# Patient Record
Sex: Female | Born: 1955 | Race: White | Hispanic: No | Marital: Married | Smoking: Never smoker
Health system: Southern US, Community
[De-identification: ages and names within clinical notes are randomized; demographics above are authoritative.]

## PROBLEM LIST (undated history)

## (undated) DIAGNOSIS — D259 Leiomyoma of uterus, unspecified: Secondary | ICD-10-CM

## (undated) HISTORY — PX: CHOLECYSTECTOMY: SHX55

## (undated) HISTORY — DX: Leiomyoma of uterus, unspecified: D25.9

---

## 2001-04-25 ENCOUNTER — Ambulatory Visit (HOSPITAL_COMMUNITY): Admission: RE | Admit: 2001-04-25 | Discharge: 2001-04-25 | Payer: Self-pay | Admitting: Specialist

## 2001-04-25 ENCOUNTER — Encounter: Payer: Self-pay | Admitting: Specialist

## 2002-01-13 ENCOUNTER — Ambulatory Visit (HOSPITAL_COMMUNITY): Admission: RE | Admit: 2002-01-13 | Discharge: 2002-01-13 | Payer: Self-pay | Admitting: Family Medicine

## 2002-01-13 ENCOUNTER — Encounter: Payer: Self-pay | Admitting: Family Medicine

## 2003-05-04 ENCOUNTER — Encounter: Payer: Self-pay | Admitting: Obstetrics & Gynecology

## 2003-05-04 ENCOUNTER — Ambulatory Visit (HOSPITAL_COMMUNITY): Admission: RE | Admit: 2003-05-04 | Discharge: 2003-05-04 | Payer: Self-pay | Admitting: Obstetrics & Gynecology

## 2006-03-08 ENCOUNTER — Ambulatory Visit (HOSPITAL_COMMUNITY): Admission: RE | Admit: 2006-03-08 | Discharge: 2006-03-08 | Payer: Self-pay | Admitting: Obstetrics & Gynecology

## 2007-03-19 ENCOUNTER — Ambulatory Visit (HOSPITAL_COMMUNITY): Admission: RE | Admit: 2007-03-19 | Discharge: 2007-03-19 | Payer: Self-pay | Admitting: Specialist

## 2007-03-21 ENCOUNTER — Ambulatory Visit (HOSPITAL_COMMUNITY): Admission: RE | Admit: 2007-03-21 | Discharge: 2007-03-21 | Payer: Self-pay | Admitting: Obstetrics and Gynecology

## 2010-05-17 LAB — BASIC METABOLIC PANEL
BUN: 15 mg/dL (ref 4–21)
Creatinine: 0.7 mg/dL (ref 0.5–1.1)
Glucose: 85 mg/dL

## 2010-05-17 LAB — CBC AND DIFFERENTIAL
HCT: 39 % (ref 36–46)
Hemoglobin: 13 g/dL (ref 12.0–16.0)

## 2010-05-24 ENCOUNTER — Ambulatory Visit (HOSPITAL_COMMUNITY): Admission: RE | Admit: 2010-05-24 | Discharge: 2010-05-24 | Payer: Self-pay | Admitting: Family Medicine

## 2010-08-21 ENCOUNTER — Encounter: Payer: Self-pay | Admitting: Internal Medicine

## 2011-08-04 ENCOUNTER — Other Ambulatory Visit (HOSPITAL_COMMUNITY): Payer: Self-pay | Admitting: Family Medicine

## 2011-08-04 DIAGNOSIS — Z139 Encounter for screening, unspecified: Secondary | ICD-10-CM

## 2011-08-17 ENCOUNTER — Ambulatory Visit (HOSPITAL_COMMUNITY)
Admission: RE | Admit: 2011-08-17 | Discharge: 2011-08-17 | Disposition: A | Discharge status: Home / Self Care | Payer: 59 | Source: Ambulatory Visit | Attending: Family Medicine | Admitting: Family Medicine

## 2011-08-17 DIAGNOSIS — Z1231 Encounter for screening mammogram for malignant neoplasm of breast: Secondary | ICD-10-CM | POA: Insufficient documentation

## 2011-08-17 DIAGNOSIS — Z139 Encounter for screening, unspecified: Secondary | ICD-10-CM

## 2011-12-29 ENCOUNTER — Ambulatory Visit (INDEPENDENT_AMBULATORY_CARE_PROVIDER_SITE_OTHER): Payer: 59 | Admitting: Family Medicine

## 2011-12-29 ENCOUNTER — Encounter: Payer: Self-pay | Admitting: Family Medicine

## 2011-12-29 VITALS — BP 122/84 | HR 78 | Resp 16 | Ht 65.0 in | Wt 229.1 lb

## 2011-12-29 DIAGNOSIS — L259 Unspecified contact dermatitis, unspecified cause: Secondary | ICD-10-CM

## 2011-12-29 DIAGNOSIS — E669 Obesity, unspecified: Secondary | ICD-10-CM

## 2011-12-29 DIAGNOSIS — L309 Dermatitis, unspecified: Secondary | ICD-10-CM

## 2011-12-29 NOTE — Assessment & Plan Note (Signed)
Encouraged hydration of skin and use a when necessary hydrocortisone

## 2011-12-29 NOTE — Patient Instructions (Signed)
Fax me the results of your previous cholesterol and labs from  F/U with OB/GYn for your exam  I recommend Calcium (1200mg )  and Vit D ( 800IU) Work on your exercise and healthy eating! Use plain soap with eczema rash is out Hydrocortisone is okay to use  Thick white lotion is best for itching  F/U 6 months

## 2011-12-29 NOTE — Progress Notes (Signed)
  Subjective:    Patient ID: Penny Mcguire, female    DOB: 01/16/1956, 56 y.o.   MRN: [id number]  HPI  Patient here to establish care. Previous primary care provider 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[employer] Medications none history reviewed Her only concern is that she gets an itchy rash on the inner part of her elbows and behind her knees and occasionally in the creases of her groin when she gets really hot she uses lotion and cortisone and this clears up quickly  Review of Systems GEN- denies fatigue, fever, weight loss,weakness, recent illness HEENT- denies eye drainage, change in vision, nasal discharge, CVS- denies chest pain, palpitations RESP- denies SOB, cough, wheeze ABD- denies N/V, change in stools, abd pain GU- denies dysuria, hematuria, dribbling, incontinence MSK- denies joint pain, muscle aches, injury Neuro- denies headache, dizziness, syncope, seizure activity      Objective:   Physical Exam GEN- NAD, alert and oriented x3, obese HEENT- PERRL, EOMI, non injected sclera, pink conjunctiva, MMM, oropharynx clear Neck- Supple, no thryomegaly CVS- RRR, no murmur RESP-CTAB ABD-NABS,soft, NT,ND Skin- mild eczematous rash in flexural areas of elbows, no other rash seen  EXT- No edema Pulses- Radial, DP- 2+        Assessment & Plan:

## 2011-12-29 NOTE — Assessment & Plan Note (Signed)
Discussed exercise with patient. She's had labs done at work approximately 2 years ago where her lipid panel and metabolic panel were normal. Typically these are needed in October and her workup we begin wait until then as her previous were normal. They do not repeat the series will be obtained at the next visit. I also discussed colonoscopy with patient and she is reluctant at this time. She has a GYN exam scheduled for next week for Pap smear

## 2012-01-01 ENCOUNTER — Encounter: Payer: Self-pay | Admitting: Family Medicine

## 2012-01-03 ENCOUNTER — Other Ambulatory Visit: Payer: Self-pay | Admitting: Adult Health

## 2012-01-03 ENCOUNTER — Other Ambulatory Visit (HOSPITAL_COMMUNITY)
Admission: RE | Admit: 2012-01-03 | Discharge: 2012-01-03 | Disposition: A | Discharge status: Home / Self Care | Payer: 59 | Source: Ambulatory Visit | Attending: Obstetrics and Gynecology | Admitting: Obstetrics and Gynecology

## 2012-01-03 DIAGNOSIS — Z1159 Encounter for screening for other viral diseases: Secondary | ICD-10-CM | POA: Insufficient documentation

## 2012-01-03 DIAGNOSIS — Z01419 Encounter for gynecological examination (general) (routine) without abnormal findings: Secondary | ICD-10-CM | POA: Insufficient documentation

## 2012-08-29 ENCOUNTER — Encounter: Payer: Self-pay | Admitting: Family Medicine

## 2012-08-29 ENCOUNTER — Ambulatory Visit (INDEPENDENT_AMBULATORY_CARE_PROVIDER_SITE_OTHER): Payer: 59 | Admitting: Family Medicine

## 2012-08-29 VITALS — BP 120/72 | HR 100 | Resp 18 | Ht 65.0 in | Wt 226.0 lb

## 2012-08-29 DIAGNOSIS — L309 Dermatitis, unspecified: Secondary | ICD-10-CM

## 2012-08-29 DIAGNOSIS — E669 Obesity, unspecified: Secondary | ICD-10-CM

## 2012-08-29 DIAGNOSIS — R21 Rash and other nonspecific skin eruption: Secondary | ICD-10-CM

## 2012-08-29 DIAGNOSIS — L259 Unspecified contact dermatitis, unspecified cause: Secondary | ICD-10-CM

## 2012-08-29 MED ORDER — METHYLPREDNISOLONE ACETATE 40 MG/ML IJ SUSP
40.0000 mg | Freq: Once | INTRAMUSCULAR | Status: AC
  Administered 2012-08-29: 40 mg via INTRAMUSCULAR

## 2012-08-29 MED ORDER — METHYLPREDNISOLONE 4 MG PO KIT: PACK | ORAL | Status: DC

## 2012-08-29 NOTE — Assessment & Plan Note (Signed)
Very pruritic in nature, appears to be a dermatitis of some sort. Given depo medrol, medrol dose pak start tomorrow Benadryl prn Oatmeal bath

## 2012-08-29 NOTE — Addendum Note (Signed)
Addended by: Kandis Fantasia B on: 08/29/2012 04:48 PM   Modules accepted: Orders

## 2012-08-29 NOTE — Progress Notes (Signed)
  Subjective:    Patient ID: Penny Mcguire, female    DOB: April 25, 1956, 57 y.o.   MRN: [id number]  HPI  Patient here with worsening eczema   The past 3 weeks she noticed that her skin was getting very dry then she began to break out in a red rash which is around her chest back abdomen and legs. Is very itchy in nature she's only use lotions and no other topical medications. She denies any fever, URI symptoms.  For work to not have to wellness screening this year therefore she does not have fasting labs done She was exercising with a partner at work around lunchtime however has slacked off of this  Review of Systems - per above  GEN- denies fatigue, fever, weight loss,weakness, recent illness HEENT- denies eye drainage, change in vision, nasal discharge, CVS- denies chest pain, palpitations RESP- denies SOB, cough, wheeze ABD- denies N/V, change in stools, abd pain GU- denies dysuria, hematuria, dribbling, incontinence MSK- denies joint pain, muscle aches, injury Neuro- denies headache, dizziness, syncope, seizure activity      Objective:   Physical Exam  GEN- NAD, alert and oriented x3, obese HEENT- PERRL, EOMI, non injected sclera, pink conjunctiva, MMM, oropharynx clear Neck- Supple, no LAD CVS- RRR, no murmur RESP-CTAB EXT- No edema Pulses- Radial 2+ Skin- erythematous fine maculopapular rash on chest, back, upper arms and thighs, +excoriations, no warmth , no pustules, no vesicles, face spared, palms spared      Assessment & Plan:

## 2012-08-29 NOTE — Assessment & Plan Note (Signed)
She was on the right track, given encouragement to restart her walking program, fasting labs to be done

## 2012-08-29 NOTE — Patient Instructions (Signed)
Get labs done fasting  Start the prednisone pak tomorrow Depo medrol shot given today  Use benadryl as needed Get back to your exercise routine Schedule your mammogram  F/U as needed or in 1 year

## 2012-08-29 NOTE — Assessment & Plan Note (Signed)
She does have outbreak in her typical areas, but the extensive rash is not eczema

## 2012-08-30 LAB — CBC
HCT: 41.4 % (ref 36.0–46.0)
MCH: 28.9 pg (ref 26.0–34.0)
MCV: 87.9 fL (ref 78.0–100.0)
Platelets: 283 10*3/uL (ref 150–400)
RBC: 4.71 MIL/uL (ref 3.87–5.11)
RDW: 13.9 % (ref 11.5–15.5)
WBC: 6.5 10*3/uL (ref 4.0–10.5)

## 2012-08-30 LAB — LIPID PANEL
HDL: 58 mg/dL (ref >39)
LDL Cholesterol: 136 mg/dL — ABNORMAL HIGH (ref 0–99)
Total CHOL/HDL Ratio: 3.7 Ratio
Triglycerides: 101 mg/dL (ref <150)
VLDL: 20 mg/dL (ref 0–40)

## 2012-08-30 LAB — COMPREHENSIVE METABOLIC PANEL
ALT: 25 U/L (ref 0–35)
Alkaline Phosphatase: 92 U/L (ref 39–117)
CO2: 24 mEq/L (ref 19–32)
Creat: 0.77 mg/dL (ref 0.50–1.10)
Sodium: 141 mEq/L (ref 135–145)
Total Bilirubin: 0.4 mg/dL (ref 0.3–1.2)
Total Protein: 7.1 g/dL (ref 6.0–8.3)

## 2012-09-09 ENCOUNTER — Telehealth: Payer: Self-pay | Admitting: Family Medicine

## 2012-09-10 NOTE — Telephone Encounter (Signed)
Called and left message on home answering machine as I did not want to bother her at work.  Will await call back.

## 2013-03-05 ENCOUNTER — Other Ambulatory Visit: Payer: Self-pay | Admitting: Family Medicine

## 2013-03-05 DIAGNOSIS — Z139 Encounter for screening, unspecified: Secondary | ICD-10-CM

## 2013-03-07 ENCOUNTER — Ambulatory Visit (HOSPITAL_COMMUNITY)
Admission: RE | Admit: 2013-03-07 | Discharge: 2013-03-07 | Disposition: A | Discharge status: Home / Self Care | Payer: 59 | Source: Ambulatory Visit | Attending: Family Medicine | Admitting: Family Medicine

## 2013-03-07 DIAGNOSIS — Z139 Encounter for screening, unspecified: Secondary | ICD-10-CM

## 2013-03-07 DIAGNOSIS — Z1231 Encounter for screening mammogram for malignant neoplasm of breast: Secondary | ICD-10-CM | POA: Insufficient documentation

## 2013-03-10 ENCOUNTER — Encounter: Payer: Self-pay | Admitting: Family Medicine

## 2014-01-16 ENCOUNTER — Ambulatory Visit (INDEPENDENT_AMBULATORY_CARE_PROVIDER_SITE_OTHER): Payer: 59 | Admitting: Family Medicine

## 2014-01-16 ENCOUNTER — Encounter: Payer: Self-pay | Admitting: Family Medicine

## 2014-01-16 VITALS — BP 136/74 | HR 64 | Temp 97.8°F | Resp 14 | Ht 64.0 in | Wt 221.0 lb

## 2014-01-16 DIAGNOSIS — M545 Low back pain, unspecified: Secondary | ICD-10-CM

## 2014-01-16 DIAGNOSIS — M25561 Pain in right knee: Secondary | ICD-10-CM

## 2014-01-16 DIAGNOSIS — E669 Obesity, unspecified: Secondary | ICD-10-CM

## 2014-01-16 DIAGNOSIS — M25569 Pain in unspecified knee: Secondary | ICD-10-CM

## 2014-01-16 MED ORDER — METHYLPREDNISOLONE (PAK) 4 MG PO TABS: ORAL_TABLET | ORAL | Status: DC

## 2014-01-16 MED ORDER — TRAMADOL HCL 50 MG PO TABS: 50.0000 mg | ORAL_TABLET | Freq: Three times a day (TID) | ORAL | Status: DC | PRN

## 2014-01-16 NOTE — Patient Instructions (Signed)
Start steroid dose pak Take Ultram as needed for pain Get the xrays done F/U as needed

## 2014-01-17 NOTE — Progress Notes (Signed)
Patient ID: Penny Mcguire, female   DOB: 1956/06/29, 58 y.o.   MRN: [id number]   Subjective:    Patient ID: Penny Mcguire, female    DOB: 1956/04/10, 58 y.o.   MRN: [id number]  Patient presents for R knee and hip pain  patient here with right knee pain and low back pain for the past couple months. No specific injury. 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 She states she had swelling in the knee and felt like he was clinically her. She did take some ibuprofen but this was not helping as much. Her hip pain is mostly in the back for lichen her buttocks. She denies any radiating pain to her legs denies any tingling or numbness in the lower extremities. She's not had any falls. TCC pain in her knee and back is worsened she lays on that side and also she's been sitting for prolonged period of time she is very stiff afterwards   Review Of Systems:  GEN- denies fatigue, fever, weight loss,weakness, recent illness HEENT- denies eye drainage, change in vision, nasal discharge, CVS- denies chest pain, palpitations RESP- denies SOB, cough, wheeze ABD- denies N/V, change in stools, abd pain GU- denies dysuria, hematuria, dribbling, incontinence MSK- + joint pain, muscle aches, injury Neuro- denies headache, dizziness, syncope, seizure activity       Objective:    BP 136/74  Pulse 64  Temp(Src) 97.8 F (36.6 C) (Oral)  Resp 14  Ht 5\' 4"  (1.626 m)  Wt 221 lb (100.245 kg)  BMI 37.92 kg/m2 GEN- NAD, alert and oriented x3 HEENT- PERRL, EOMI, non injected sclera, pink conjunctiva, MMM, oropharynx clear Neck- Supple, no thyromegaly CVS- RRR, no murmur RESP-CTAB MSK- Spine, TTP lumbar spine and towards right paraspinals, neg SLR, fair ROM, Bilat knees normal inspection, no effusion, mild crepitus right knee, fair ROM, HIp- good ROM Pulses- Radial, DP- 2+        Assessment & Plan:      Problem List Items Addressed This Visit   None    Visit Diagnoses   Right knee pain    -  Primary    Will  place of medrol dosepak because of concurrent back pain, Ultram also given, obtain image, refer to ortho if needed    Relevant Orders       DG Knee 4 Views W/Patella Right    Right-sided low back pain without sciatica        Medrol dosepak, L spine, likley DDD    Relevant Medications       traMADol (ULTRAM) tablet 50 mg       Methylprednisolone (MEDROL PAK) 4 mg po tab    Other Relevant Orders       DG Lumbar Spine Complete       Note: This dictation was prepared with Dragon dictation along with smaller phrase technology. Any transcriptional errors that result from this process are unintentional.

## 2014-01-20 ENCOUNTER — Ambulatory Visit (HOSPITAL_COMMUNITY)
Admission: RE | Admit: 2014-01-20 | Discharge: 2014-01-20 | Disposition: A | Discharge status: Home / Self Care | Payer: 59 | Source: Ambulatory Visit | Attending: Family Medicine | Admitting: Family Medicine

## 2014-01-20 DIAGNOSIS — M25561 Pain in right knee: Secondary | ICD-10-CM

## 2014-01-20 DIAGNOSIS — M545 Low back pain, unspecified: Secondary | ICD-10-CM

## 2014-01-20 DIAGNOSIS — M25569 Pain in unspecified knee: Secondary | ICD-10-CM | POA: Insufficient documentation

## 2014-01-20 DIAGNOSIS — M47817 Spondylosis without myelopathy or radiculopathy, lumbosacral region: Secondary | ICD-10-CM | POA: Insufficient documentation

## 2014-09-08 ENCOUNTER — Other Ambulatory Visit: Payer: Self-pay | Admitting: Family Medicine

## 2014-09-08 DIAGNOSIS — Z1231 Encounter for screening mammogram for malignant neoplasm of breast: Secondary | ICD-10-CM

## 2014-09-11 ENCOUNTER — Ambulatory Visit (HOSPITAL_COMMUNITY)
Admission: RE | Admit: 2014-09-11 | Discharge: 2014-09-11 | Disposition: A | Discharge status: Home / Self Care | Payer: 59 | Source: Ambulatory Visit | Attending: Family Medicine | Admitting: Family Medicine

## 2014-09-11 DIAGNOSIS — Z1231 Encounter for screening mammogram for malignant neoplasm of breast: Secondary | ICD-10-CM | POA: Insufficient documentation

## 2015-09-10 ENCOUNTER — Ambulatory Visit (INDEPENDENT_AMBULATORY_CARE_PROVIDER_SITE_OTHER): Payer: 59 | Admitting: Family Medicine

## 2015-09-10 ENCOUNTER — Encounter: Payer: Self-pay | Admitting: Family Medicine

## 2015-09-10 VITALS — BP 124/78 | HR 80 | Temp 97.8°F | Resp 14 | Ht 65.0 in | Wt 214.0 lb

## 2015-09-10 DIAGNOSIS — J019 Acute sinusitis, unspecified: Secondary | ICD-10-CM

## 2015-09-10 MED ORDER — FLUCONAZOLE 150 MG PO TABS: 150.0000 mg | ORAL_TABLET | Freq: Once | ORAL | Status: DC

## 2015-09-10 MED ORDER — AMOXICILLIN-POT CLAVULANATE 875-125 MG PO TABS: 1.0000 | ORAL_TABLET | Freq: Two times a day (BID) | ORAL | Status: DC

## 2015-09-10 NOTE — Progress Notes (Signed)
   Subjective:    Patient ID: Penny Mcguire, female    DOB: 05/19/1956, 60 y.o.   MRN: [id number]  HPIis here today complaining of 3 weeks of sinus pressure in her frontal and maxillary sinuses bilaterally. She reports subjective fevers, headache, and nonstop rhinorrhea. She has sinus tenderness to palpation in the right maxillary and right frontal sinus. She denies any cough or chest congestion or sore throat Past Medical History  Diagnosis Date  . Fibroid uterus   . Gestational diabetes    No current outpatient prescriptions on file prior to visit.   No current facility-administered medications on file prior to visit.   No Known Allergies Social History   Social History  . Marital Status: Married    Spouse Name: N/A  . Number of Children: N/A  . Years of Education: N/A   Occupational History  . Not on file.   Social History Main Topics  . Smoking status: Never Smoker   . Smokeless tobacco: Never Used  . Alcohol Use: No  . Drug Use: No  . Sexual Activity: Yes   Other Topics Concern  . Not on file   Social History Narrative      Review of Systems  All other systems reviewed and are negative.      Objective:   Physical Exam  Constitutional: She appears well-developed and well-nourished.  HENT:  Right Ear: Tympanic membrane, external ear and ear canal normal.  Left Ear: Tympanic membrane, external ear and ear canal normal.  Nose: Mucosal edema and rhinorrhea present. Right sinus exhibits maxillary sinus tenderness and frontal sinus tenderness.  Mouth/Throat: Oropharynx is clear and moist. No oropharyngeal exudate.  Neck: Neck supple.  Cardiovascular: Normal rate, regular rhythm and normal heart sounds.   Pulmonary/Chest: Effort normal and breath sounds normal.  Lymphadenopathy:    She has no cervical adenopathy.  Vitals reviewed.         Assessment & Plan:  Acute rhinosinusitis - Plan: amoxicillin-clavulanate (AUGMENTIN) 875-125 MG tablet  Patient  has a sinus infection. Begin Augmentin 875 mg one by mouth twice a day for 10 days. I did give her a prescription for Diflucan in case she develops a yeast infection.

## 2015-11-11 ENCOUNTER — Telehealth: Payer: Self-pay | Admitting: Family Medicine

## 2015-11-15 ENCOUNTER — Ambulatory Visit (INDEPENDENT_AMBULATORY_CARE_PROVIDER_SITE_OTHER): Payer: 59 | Admitting: Family Medicine

## 2015-11-15 ENCOUNTER — Encounter: Payer: Self-pay | Admitting: Family Medicine

## 2015-11-15 VITALS — BP 128/72 | HR 66 | Temp 97.9°F | Resp 14 | Ht 65.0 in | Wt 212.0 lb

## 2015-11-15 DIAGNOSIS — J0100 Acute maxillary sinusitis, unspecified: Secondary | ICD-10-CM | POA: Diagnosis not present

## 2015-11-15 MED ORDER — FLUTICASONE PROPIONATE 50 MCG/ACT NA SUSP: 2.0000 | Freq: Every day | NASAL | Status: DC

## 2015-11-15 MED ORDER — AMOXICILLIN-POT CLAVULANATE 875-125 MG PO TABS: 1.0000 | ORAL_TABLET | Freq: Two times a day (BID) | ORAL | Status: DC

## 2015-11-15 MED ORDER — CETIRIZINE HCL 10 MG PO TABS: 10.0000 mg | ORAL_TABLET | Freq: Every day | ORAL | Status: DC

## 2015-11-15 MED ORDER — FLUCONAZOLE 150 MG PO TABS: 150.0000 mg | ORAL_TABLET | Freq: Every day | ORAL | Status: DC

## 2015-11-15 NOTE — Progress Notes (Signed)
Patient ID: Penny Mcguire, female   DOB: 1956-06-08, 60 y.o.   MRN: [id number]    Subjective:    Patient ID: Penny Mcguire, female    DOB: 1956-04-28, 60 y.o.   MRN: [id number]  Patient presents for Illness Patient here with recurrent sinusitis symptoms. For the past week or more she has had nasal congestion drainage postnasal drip causing a scratchy throat is also had dry cough. She initially had some low-grade fever but has no results. She has been using Robitussin and NyQuil. She also noticed a hoarse voice. She is a nonsmoker.    Review Of Systems:  GEN- denies fatigue, fever, weight loss,weakness, recent illness HEENT- denies eye drainage, change in vision,+ nasal discharge, CVS- denies chest pain, palpitations RESP- denies SOB, +cough, wheeze ABD- denies N/V, change in stools, abd pain Neuro- denies headache, dizziness, syncope, seizure activity       Objective:    BP 128/72 mmHg  Pulse 66  Temp(Src) 97.9 F (36.6 C) (Oral)  Resp 14  Ht 5\' 5"  (1.651 m)  Wt 212 lb (96.163 kg)  BMI 35.28 kg/m2  SpO2 97% GEN- NAD, alert and oriented x3 HEENT- PERRL, EOMI, non injected sclera, pink conjunctiva, MMM, oropharynx mild injection, TM clear bilat no effusion,  + maxillary sinus tenderness, inflammed turbinates,  Nasal drainage  Neck- Supple, no LAD CVS- RRR, no murmur RESP-CTAB Pulses- Radial 2+         Assessment & Plan:      Problem List Items Addressed This Visit    None    Visit Diagnoses    Acute maxillary sinusitis, recurrence not specified    -  Primary    Treated in Feb for sinusitis, did completely resolve, I think there is an allergy component as well., Start zyrtec, flonase, given augmentin, Diflucan due to yeast infections, after antibiotics Pt overdue for CPE - to schedule    Relevant Medications    amoxicillin-clavulanate (AUGMENTIN) 875-125 MG tablet    cetirizine (ZYRTEC) 10 MG tablet    fluticasone (FLONASE) 50 MCG/ACT nasal spray    fluconazole (DIFLUCAN) 150 MG tablet       Note: This dictation was prepared with Dragon dictation along with smaller phrase technology. Any transcriptional errors that result from this process are unintentional.

## 2015-11-15 NOTE — Patient Instructions (Signed)
Take antibiotics Use nasal spray and allergy pill to prevent recurrence Schedule a physical - Come fasting

## 2016-03-30 ENCOUNTER — Other Ambulatory Visit: Payer: Self-pay | Admitting: Obstetrics & Gynecology

## 2016-03-30 DIAGNOSIS — Z1231 Encounter for screening mammogram for malignant neoplasm of breast: Secondary | ICD-10-CM

## 2016-07-25 ENCOUNTER — Other Ambulatory Visit: Payer: Self-pay | Admitting: Adult Health

## 2016-07-25 DIAGNOSIS — Z1231 Encounter for screening mammogram for malignant neoplasm of breast: Secondary | ICD-10-CM

## 2016-07-27 ENCOUNTER — Ambulatory Visit (HOSPITAL_COMMUNITY)
Admission: RE | Admit: 2016-07-27 | Discharge: 2016-07-27 | Disposition: A | Discharge status: Home / Self Care | Payer: 59 | Source: Ambulatory Visit | Attending: Adult Health | Admitting: Adult Health

## 2016-07-27 DIAGNOSIS — Z1231 Encounter for screening mammogram for malignant neoplasm of breast: Secondary | ICD-10-CM | POA: Diagnosis not present

## 2017-02-12 ENCOUNTER — Encounter (HOSPITAL_COMMUNITY): Payer: Self-pay

## 2017-02-12 ENCOUNTER — Emergency Department (HOSPITAL_COMMUNITY): Payer: PRIVATE HEALTH INSURANCE

## 2017-02-12 ENCOUNTER — Emergency Department (HOSPITAL_COMMUNITY)
Admission: EM | Admit: 2017-02-12 | Discharge: 2017-02-12 | Disposition: A | Discharge status: Home / Self Care | Payer: PRIVATE HEALTH INSURANCE | Attending: Emergency Medicine | Admitting: Emergency Medicine

## 2017-02-12 DIAGNOSIS — Y9229 Other specified public building as the place of occurrence of the external cause: Secondary | ICD-10-CM | POA: Diagnosis not present

## 2017-02-12 DIAGNOSIS — J3489 Other specified disorders of nose and nasal sinuses: Secondary | ICD-10-CM | POA: Diagnosis not present

## 2017-02-12 DIAGNOSIS — W0110XA Fall on same level from slipping, tripping and stumbling with subsequent striking against unspecified object, initial encounter: Secondary | ICD-10-CM | POA: Insufficient documentation

## 2017-02-12 DIAGNOSIS — S022XXA Fracture of nasal bones, initial encounter for closed fracture: Secondary | ICD-10-CM | POA: Insufficient documentation

## 2017-02-12 DIAGNOSIS — Y998 Other external cause status: Secondary | ICD-10-CM | POA: Diagnosis not present

## 2017-02-12 DIAGNOSIS — R51 Headache: Secondary | ICD-10-CM | POA: Diagnosis present

## 2017-02-12 DIAGNOSIS — S022XXB Fracture of nasal bones, initial encounter for open fracture: Secondary | ICD-10-CM

## 2017-02-12 DIAGNOSIS — Y9389 Activity, other specified: Secondary | ICD-10-CM | POA: Insufficient documentation

## 2017-02-12 MED ORDER — AMOXICILLIN-POT CLAVULANATE 875-125 MG PO TABS: 1.0000 | ORAL_TABLET | Freq: Two times a day (BID) | ORAL | 0 refills | Status: DC

## 2017-02-12 MED ORDER — IBUPROFEN 800 MG PO TABS
800.0000 mg | ORAL_TABLET | Freq: Once | ORAL | Status: AC
  Administered 2017-02-12: 800 mg via ORAL
  Filled 2017-02-12: qty 1

## 2017-02-12 MED ORDER — IBUPROFEN 600 MG PO TABS: 600.0000 mg | ORAL_TABLET | Freq: Four times a day (QID) | ORAL | 0 refills | Status: DC | PRN

## 2017-02-12 NOTE — ED Triage Notes (Signed)
Patient reports of tripping and fell forwards and hit face on floor. Denies loc, neck or back pain. Has swelling noted above right eye brown as well as nose bleeding. Bleeding controlled at this time.

## 2017-02-12 NOTE — Discharge Instructions (Signed)
Avoid blowing your nose as discussed for the next few days.  Sleeping in a semi upright (propped on pillows) can help minimize swelling and bruising.  Call Dr. Benjamine Mola as discussed for further evaluation and reduction (straightening of your nasal bone).  He will want to do this once the swelling is improved.

## 2017-02-13 MED FILL — IBUPROFEN 600 MG TABLET: 600 | 8 days supply | Qty: 30 | Fill #0

## 2017-02-13 MED FILL — AMOX-CLAV 875-125 MG TABLET: 875-125 | 7 days supply | Qty: 14 | Fill #0

## 2017-02-14 NOTE — ED Provider Notes (Signed)
[facility] DEPT Provider Note   CSN: [id number] Arrival date & time: 02/12/17  1330     History   Chief Complaint Chief Complaint  Patient presents with  . Fall    HPI Penny Mcguire is a 61 y.o. female who works at [employer], tripped and fell in the cafeteria prior to arrival landing face forward, predominantly hitting her nose and her right forehead.  She initially had a significant nose bleed which has resolved after applying pressure.  She endorses persistent headache, denies loc, n/v, dizziness, confusion, neck pain. She denies other pain or injury and has taken ibuprofen prior to arrival with some improvement in headache.    The history is provided by the patient.    Past Medical History:  Diagnosis Date  . Fibroid uterus     Patient Active Problem List   Diagnosis Date Noted  . Eczema 12/29/2011  . Obesity, unspecified 12/29/2011    Past Surgical History:  Procedure Laterality Date  . CESAREAN SECTION     2  . CHOLECYSTECTOMY      OB History    No data available       Home Medications    Prior to Admission medications   Medication Sig Start Date End Date Taking? Authorizing Provider  amoxicillin-clavulanate (AUGMENTIN) 875-125 MG tablet Take 1 tablet by mouth every 12 (twelve) hours. 02/12/17   Evalee Jefferson, PA-C  cetirizine (ZYRTEC) 10 MG tablet Take 1 tablet (10 mg total) by mouth daily. 11/15/15   Alycia Rossetti, MD  fluconazole (DIFLUCAN) 150 MG tablet Take 1 tablet (150 mg total) by mouth daily. 11/15/15   [place], Modena Nunnery, MD  fluticasone [facility]) 50 MCG/ACT nasal spray Place 2 sprays into both nostrils daily. 11/15/15   , Modena Nunnery, MD  ibuprofen (ADVIL,MOTRIN) 600 MG tablet Take 1 tablet (600 mg total) by mouth every 6 (six) hours as needed. 02/12/17   Evalee Jefferson, PA-C    Family History Family History  Problem Relation Age of Onset  . Hyperlipidemia Mother   . Hypertension Mother   . Hyperlipidemia Sister   . Diabetes Brother      Social History Social History  Substance Use Topics  . Smoking status: Never Smoker  . Smokeless tobacco: Never Used  . Alcohol use No     Allergies   Patient has no known allergies.   Review of Systems Review of Systems  Constitutional: Negative for fever.  HENT: Positive for facial swelling and nosebleeds. Negative for congestion, ear discharge, rhinorrhea and sore throat.   Eyes: Negative.   Respiratory: Negative for chest tightness and shortness of breath.   Cardiovascular: Negative for chest pain.  Gastrointestinal: Negative for abdominal pain, nausea and vomiting.  Genitourinary: Negative.   Musculoskeletal: Negative for arthralgias, joint swelling and neck pain.  Skin: Negative.  Negative for rash and wound.  Neurological: Positive for headaches. Negative for dizziness, weakness, light-headedness and numbness.  Psychiatric/Behavioral: Negative.      Physical Exam Updated Vital Signs BP (!) 149/86 (BP Location: Right Arm)   Pulse 76   Temp 97.6 F (36.4 C) (Oral)   Resp 17   Ht 5\' 5"  (1.651 m)   Wt 102.1 kg (225 lb)   SpO2 97%   BMI 37.44 kg/m   Physical Exam  Constitutional: She appears well-developed and well-nourished.  HENT:  Head: Normocephalic.  Right Ear: Tympanic membrane normal. No hemotympanum.  Left Ear: Tympanic membrane normal. No hemotympanum.  Nose: Sinus tenderness and nasal deformity present.  No nose lacerations or nasal septal hematoma. No epistaxis.  No current epistaxis. Small hematoma right forehead.    Eyes: Conjunctivae are normal.  Neck: Normal range of motion. No spinous process tenderness and no muscular tenderness present. No neck rigidity. Normal range of motion present.  Cardiovascular: Normal rate, regular rhythm, normal heart sounds and intact distal pulses.   Pulmonary/Chest: Effort normal and breath sounds normal. She has no wheezes.  Abdominal: Soft. Bowel sounds are normal. There is no tenderness.  Musculoskeletal:  Normal range of motion.  Neurological: She is alert.  Skin: Skin is warm and dry.  Psychiatric: She has a normal mood and affect.  Nursing note and vitals reviewed.    ED Treatments / Results  Labs (all labs ordered are listed, but only abnormal results are displayed) Labs Reviewed - No data to display  EKG  EKG Interpretation None       Radiology Ct Maxillofacial Wo Contrast  Result Date: 02/12/2017 CLINICAL DATA:  61 year old female with right periorbital region swelling and epistaxis after fall face first onto ground. EXAM: CT MAXILLOFACIAL WITHOUT CONTRAST TECHNIQUE: Multidetector CT imaging of the maxillofacial structures was performed. Multiplanar CT image reconstructions were also generated. A small metallic BB was placed on the right temple in order to reliably differentiate right from left. COMPARISON:  None. FINDINGS: Osseous: Intact mandible. Central skullbase appears intact. Grossly intact visible cervical spine. No maxilla or zygoma fracture. Anterior superior nasal bone fracture on the left (series 3, image 33) associated with trace subcutaneous gas and bridge of nose soft tissue swelling. There is leftward deviation of the anterior nasal septum, a degree of which might be acute. No nasal septal hematoma. The nasal cavity otherwise is within normal limits. Orbits: Orbital walls are intact. Bilateral orbits soft tissues are normal. Sinuses: Clear aside from trace sphenoid sinus mucosal thickening. Bilateral tympanic cavities and mastoids are clear. Soft tissues: Partially retropharyngeal course of the right carotid. Negative noncontrast pharynx, parapharyngeal spaces, sublingual space, submandibular glands and parotid glands. 10 mm thick right forehead scalp contusion or hematoma overlying the right frontal sinus which remains intact and normally aerated (series 2, image 14). Other visible scalp soft tissues are within normal limits. Limited intracranial: Visible brain parenchyma  remarkable for possible chronic lacune of the left corona radiata tracking toward the insula. IMPRESSION: 1. Superior nasal bone fracture with overlying soft tissue injury and trace subcutaneous gas. Leftward nasal septal deviation, a degree of which might be acute. 2. No other facial fracture. 3. Right forehead/scalp hematoma without underlying fracture. Electronically Signed   By: Genevie Ann M.D.   On: 02/12/2017 14:13    Procedures Procedures (including critical care time)  Medications Ordered in ED Medications  ibuprofen (ADVIL,MOTRIN) tablet 800 mg (800 mg Oral Given 02/12/17 1414)     Initial Impression / Assessment and Plan / ED Course  I have reviewed the triage vital signs and the nursing notes.  Pertinent labs & imaging results that were available during my care of the patient were reviewed by me and considered in my medical decision making (see chart for details).     Moderate edema of nasal bridge, deformity appreciated.  No hematoma.  Pt deferred pain medicine, opts for ibuprofen, also covered with augmentin given fracture.  Avoid blowing nose, instructions given for care if bleeding resumes, return precautions discussed.  Pt referred to Dr. Benjamine Mola for definitive care of this injury.  Final Clinical Impressions(s) / ED Diagnoses   Final diagnoses:  Open fracture  of nasal bone, initial encounter    New Prescriptions Discharge Medication List as of 02/12/2017  4:11 PM    START taking these medications   Details  ibuprofen (ADVIL,MOTRIN) 600 MG tablet Take 1 tablet (600 mg total) by mouth every 6 (six) hours as needed., Starting Mon 02/12/2017, Print         Evalee Jefferson, PA-C 02/14/17 [id number]    Elnora Morrison, MD 02/14/17 [phone]

## 2017-02-19 DIAGNOSIS — S022XXD Fracture of nasal bones, subsequent encounter for fracture with routine healing: Secondary | ICD-10-CM | POA: Insufficient documentation

## 2017-06-22 DIAGNOSIS — H5212 Myopia, left eye: Secondary | ICD-10-CM | POA: Diagnosis not present

## 2017-06-22 DIAGNOSIS — H524 Presbyopia: Secondary | ICD-10-CM | POA: Diagnosis not present

## 2017-06-22 DIAGNOSIS — H52223 Regular astigmatism, bilateral: Secondary | ICD-10-CM | POA: Diagnosis not present

## 2017-07-13 ENCOUNTER — Ambulatory Visit (INDEPENDENT_AMBULATORY_CARE_PROVIDER_SITE_OTHER): Payer: 59 | Admitting: Family Medicine

## 2017-07-13 ENCOUNTER — Other Ambulatory Visit: Payer: Self-pay

## 2017-07-13 ENCOUNTER — Encounter: Payer: Self-pay | Admitting: Family Medicine

## 2017-07-13 VITALS — BP 130/68 | HR 72 | Temp 97.9°F | Resp 18 | Ht 65.0 in | Wt 227.0 lb

## 2017-07-13 DIAGNOSIS — J040 Acute laryngitis: Secondary | ICD-10-CM

## 2017-07-13 DIAGNOSIS — R0982 Postnasal drip: Secondary | ICD-10-CM | POA: Diagnosis not present

## 2017-07-13 MED ORDER — METHYLPREDNISOLONE ACETATE 40 MG/ML IJ SUSP
40.0000 mg | Freq: Once | INTRAMUSCULAR | Status: AC
  Administered 2017-07-13: 40 mg via INTRAMUSCULAR

## 2017-07-13 MED ORDER — PREDNISONE 20 MG PO TABS: ORAL_TABLET | ORAL | 0 refills | Status: DC

## 2017-07-13 NOTE — Progress Notes (Signed)
   Subjective:    Patient ID: Penny Mcguire, female    DOB: August 30, 1955, 61 y.o.   MRN: [id number]  Patient presents for Illness (x2 days- nonproductive cough, hoarse, denies fever)  Patient here with hoarse voice for the past 2 days.  States she has not felt bad she has had some mild sinus drainage some down her throat.  Occasional cough but more to clear her throat.  No chest congestion no fever no real sore throat.  She did take some Tussend last night.  States that in general she feels okay    Discussed due for CPE  Review Of Systems: per above   GEN- denies fatigue, fever, weight loss,weakness,+ recent illness HEENT- denies eye drainage, change in vision, nasal discharge, CVS- denies chest pain, palpitations RESP- denies SOB, cough, wheeze ABD- denies N/V, change in stools, abd pain GU- denies dysuria, hematuria, dribbling, incontinence MSK- denies joint pain, muscle aches, injury Neuro- denies headache, dizziness, syncope, seizure activity       Objective:    BP 130/68   Pulse 72   Temp 97.9 F (36.6 C) (Oral)   Resp 18   Ht 5\' 5"  (1.651 m)   Wt 227 lb (103 kg)   SpO2 98%   BMI 37.77 kg/m  GEN- NAD, alert and oriented x3 HEENT- PERRL, EOMI, non injected sclera, pink conjunctiva, MMM, oropharynxclear , TM clear bilat no effusion,  No  maxillary sinus tenderness,clear  Nasal drainage  Hoarse Voice Neck- Supple, no LAD CVS- RRR, no murmur RESP-CTAB Pulses- Radial 2+          Assessment & Plan:      Problem List Items Addressed This Visit    None    Visit Diagnoses    Acute laryngitis    -  Primary   She had in OCt now with recurrent symptoms,has some post nasal /sinus drip that I think is main cause, though reflux also possible. Treat with Depo MEdrol, STERoid x 5 days, given nasocort for post nasal drainage If she has recurrence, needs to see ENT   Post-nasal drip          Note: This dictation was prepared with Dragon dictation along with smaller  phrase technology. Any transcriptional errors that result from this process are unintentional.

## 2017-07-13 NOTE — Patient Instructions (Signed)
Take steroids as prescribed Use lozenges Use nasal spray  F/U PHYSICAL

## 2017-07-13 NOTE — Addendum Note (Signed)
Addended by: Sheral Flow on: 07/13/2017 01:02 PM   Modules accepted: Orders

## 2017-10-23 ENCOUNTER — Encounter: Payer: Self-pay | Admitting: Family Medicine

## 2017-10-23 ENCOUNTER — Ambulatory Visit (INDEPENDENT_AMBULATORY_CARE_PROVIDER_SITE_OTHER): Payer: 59 | Admitting: Family Medicine

## 2017-10-23 ENCOUNTER — Other Ambulatory Visit: Payer: Self-pay

## 2017-10-23 VITALS — BP 130/64 | HR 72 | Temp 97.9°F | Resp 14 | Ht 65.0 in | Wt 221.0 lb

## 2017-10-23 DIAGNOSIS — J209 Acute bronchitis, unspecified: Secondary | ICD-10-CM | POA: Diagnosis not present

## 2017-10-23 MED ORDER — ALBUTEROL SULFATE HFA 108 (90 BASE) MCG/ACT IN AERS: 2.0000 | INHALATION_SPRAY | Freq: Four times a day (QID) | RESPIRATORY_TRACT | 0 refills | Status: DC | PRN

## 2017-10-23 MED ORDER — GUAIFENESIN-CODEINE 100-10 MG/5ML PO SOLN: 5.0000 mL | Freq: Four times a day (QID) | ORAL | 0 refills | Status: DC | PRN

## 2017-10-23 MED ORDER — PREDNISONE 20 MG PO TABS: 40.0000 mg | ORAL_TABLET | Freq: Every day | ORAL | 0 refills | Status: DC

## 2017-10-23 NOTE — Patient Instructions (Addendum)
Take robitussin codiene Take allergy tablet zyrtec/claritin Use albuterol inhaler  Prednisone  F/U Schedule a physical

## 2017-10-23 NOTE — Progress Notes (Signed)
   Subjective:    Patient ID: Penny Mcguire, female    DOB: Aug 07, 1955, 62 y.o.   MRN: [id number]  Patient presents for Illness (x4 days- sinus pressure, post nasal drip, chest congestion, productive cough wtih clear/ white mucus that worsens at night)  Pt here with cough and congestion with production, started Friday night, no fever, sinus drainage. Cough worse at night, some wheezing at night. No SOB. Taking Mucinex DM      Review Of Systems:  GEN- denies fatigue, fever, weight loss,weakness, recent illness HEENT- denies eye drainage, change in vision, nasal discharge, CVS- denies chest pain, palpitations RESP- denies SOB, cough, wheeze ABD- denies N/V, change in stools, abd pain GU- denies dysuria, hematuria, dribbling, incontinence MSK- denies joint pain, muscle aches, injury Neuro- denies headache, dizziness, syncope, seizure activity       Objective:    BP 130/64   Pulse 72   Temp 97.9 F (36.6 C) (Oral)   Resp 14   Ht 5\' 5"  (1.651 m)   Wt 221 lb (100.2 kg)   SpO2 97%   BMI 36.78 kg/m  GEN- NAD, alert and oriented x3 HEENT- PERRL, EOMI, non injected sclera, pink conjunctiva, MMM, oropharynxclear, TM clear bilat no effusion,  No maxillary sinus tenderness,clear  Nasal drainage  Neck- Supple, no LAD CVS- RRR, no murmur RESP- upper airway congestion, no rales, no wheeze EXT- No edema Pulses- Radial 2+         Assessment & Plan:      Problem List Items Addressed This Visit    None    Visit Diagnoses    Acute bronchitis, unspecified organism    -  Primary   Treat symptomatically, viral illness, prednisone, albuterol for wheeze, tightness, robitussin codiene      Note: This dictation was prepared with Dragon dictation along with smaller phrase technology. Any transcriptional errors that result from this process are unintentional.

## 2017-10-24 ENCOUNTER — Encounter: Payer: Self-pay | Admitting: Family Medicine

## 2018-10-18 ENCOUNTER — Encounter: Payer: Self-pay | Admitting: Family Medicine

## 2018-10-18 ENCOUNTER — Other Ambulatory Visit: Payer: Self-pay

## 2018-10-18 ENCOUNTER — Ambulatory Visit (INDEPENDENT_AMBULATORY_CARE_PROVIDER_SITE_OTHER): Payer: 59 | Admitting: Family Medicine

## 2018-10-18 VITALS — BP 130/90 | HR 68 | Temp 98.3°F | Resp 15 | Ht 65.0 in | Wt 235.1 lb

## 2018-10-18 DIAGNOSIS — L219 Seborrheic dermatitis, unspecified: Secondary | ICD-10-CM

## 2018-10-18 DIAGNOSIS — H01119 Allergic dermatitis of unspecified eye, unspecified eyelid: Secondary | ICD-10-CM

## 2018-10-18 DIAGNOSIS — H1013 Acute atopic conjunctivitis, bilateral: Secondary | ICD-10-CM

## 2018-10-18 DIAGNOSIS — B372 Candidiasis of skin and nail: Secondary | ICD-10-CM

## 2018-10-18 MED ORDER — FLUCONAZOLE 150 MG PO TABS: ORAL_TABLET | ORAL | 1 refills | Status: DC

## 2018-10-18 MED ORDER — NYSTATIN 100000 UNIT/GM EX POWD: Freq: Four times a day (QID) | CUTANEOUS | 2 refills | Status: DC

## 2018-10-18 MED ORDER — OLOPATADINE HCL 0.2 % OP SOLN: OPHTHALMIC | 0 refills | Status: DC

## 2018-10-18 NOTE — Progress Notes (Signed)
   Subjective:    Patient ID: Penny Mcguire, female    DOB: 11-29-1955, 63 y.o.   MRN: [id number]  Patient presents for Eczema (Has c/o eczema under eyes and under breast )   Pt here with rash around her eyes and beneath her breast , has burning sensation and itching, used neosporin around eyes, thsi has occurred another time before   she has used eczema lotion with neosporin and cortisone /powder beneath the breast, it gets a little better, then flares back up  - also gets dry flakes in her scalp   No allergies symptoms of cough ,sneezing, runny nose   No change in vision, no drainage from her eyes, but eyes have been red, used a visine in it which helped    Review Of Systems:  GEN- denies fatigue, fever, weight loss,weakness, recent illness HEENT- denies eye drainage, change in vision, nasal discharge, CVS- denies chest pain, palpitations RESP- denies SOB, cough, wheeze ABD- denies N/V, change in stools, abd pain GU- denies dysuria, hematuria, dribbling, incontinence MSK- denies joint pain, muscle aches, injury Neuro- denies headache, dizziness, syncope, seizure activity       Objective:    BP 130/90   Pulse 68   Temp 98.3 F (36.8 C) (Oral)   Resp 15   Ht 5\' 5"  (1.651 m)   Wt 235 lb 2 oz (106.7 kg)   SpO2 96%   BMI 39.13 kg/m  GEN- NAD, alert and oriented x3 HEENT- PERRL, EOMI, mild  injected  Right sclera, pink conjunctiva, MMM, oropharynx clear, nares clea,r TM clear no effusion ERythema UPPER Lid and skin at corner of eyes lateral side, no swelling of lid, irritated appearing Neck- Supple, no LAD CVS- RRR, no murmur RESP-CTAB Skin- beneath breast large patches of erythema with mild maceration/moisture, no pustules or vesicles, small cracks in skin  NT Dry patches in scalp and in ear, flaking       Assessment & Plan:      Problem List Items Addressed This Visit    None    Visit Diagnoses    Intertriginous candidiasis    -  Primary   Use topical  nystatin powder, soak up moisture, can use in groin as well   Relevant Medications   fluconazole (DIFLUCAN) 150 MG tablet   nystatin (MYCOSTATIN/NYSTOP) powder   Eyelid dermatitis, allergic/contact       Given sample of Eucrisa to use externally, and pataday eye drop, if she gets pus/bact conjunctivits will send polytrim drop , more allergic appearing   Allergic conjunctivitis of both eyes       Baby shampoo to clean lids   Seborrheic dermatitis of scalp       Nizoral shampoo to scalp, ear      Note: This dictation was prepared with Dragon dictation along with smaller phrase technology. Any transcriptional errors that result from this process are unintentional.

## 2018-10-18 NOTE — Patient Instructions (Signed)
Use Nizoral shampoo to scalp Use powder beneath breast Take diflucan Eye drop given Use baby shampoo to wash eyelid/lashes F/U Schedule physical in 6 months

## 2018-10-20 ENCOUNTER — Encounter: Payer: Self-pay | Admitting: Family Medicine

## 2019-03-06 ENCOUNTER — Ambulatory Visit (HOSPITAL_COMMUNITY)
Admission: RE | Admit: 2019-03-06 | Discharge: 2019-03-06 | Disposition: A | Discharge status: Home / Self Care | Payer: 59 | Source: Ambulatory Visit | Attending: Family Medicine | Admitting: Family Medicine

## 2019-03-06 ENCOUNTER — Other Ambulatory Visit (HOSPITAL_COMMUNITY): Payer: Self-pay | Admitting: Family Medicine

## 2019-03-06 ENCOUNTER — Other Ambulatory Visit: Payer: Self-pay

## 2019-03-06 DIAGNOSIS — Z1231 Encounter for screening mammogram for malignant neoplasm of breast: Secondary | ICD-10-CM | POA: Insufficient documentation

## 2020-04-23 DIAGNOSIS — H5210 Myopia, unspecified eye: Secondary | ICD-10-CM | POA: Diagnosis not present

## 2020-08-12 ENCOUNTER — Other Ambulatory Visit (HOSPITAL_COMMUNITY): Payer: Self-pay | Admitting: Family Medicine

## 2020-08-12 DIAGNOSIS — Z1231 Encounter for screening mammogram for malignant neoplasm of breast: Secondary | ICD-10-CM

## 2020-08-16 ENCOUNTER — Ambulatory Visit (HOSPITAL_COMMUNITY)
Admission: RE | Admit: 2020-08-16 | Discharge: 2020-08-16 | Disposition: A | Discharge status: Home / Self Care | Payer: 59 | Source: Ambulatory Visit | Attending: Family Medicine | Admitting: Family Medicine

## 2020-08-16 DIAGNOSIS — Z1231 Encounter for screening mammogram for malignant neoplasm of breast: Secondary | ICD-10-CM | POA: Diagnosis not present

## 2020-08-18 ENCOUNTER — Ambulatory Visit (HOSPITAL_COMMUNITY): Payer: 59

## 2020-10-15 ENCOUNTER — Ambulatory Visit (INDEPENDENT_AMBULATORY_CARE_PROVIDER_SITE_OTHER): Payer: 59 | Admitting: Internal Medicine

## 2020-10-15 ENCOUNTER — Encounter: Payer: Self-pay | Admitting: Internal Medicine

## 2020-10-15 ENCOUNTER — Other Ambulatory Visit: Payer: Self-pay

## 2020-10-15 ENCOUNTER — Other Ambulatory Visit (HOSPITAL_COMMUNITY): Payer: Self-pay | Admitting: Internal Medicine

## 2020-10-15 VITALS — BP 131/84 | HR 74 | Resp 18 | Ht 65.0 in | Wt 237.1 lb

## 2020-10-15 DIAGNOSIS — Z124 Encounter for screening for malignant neoplasm of cervix: Secondary | ICD-10-CM | POA: Diagnosis not present

## 2020-10-15 DIAGNOSIS — G8929 Other chronic pain: Secondary | ICD-10-CM

## 2020-10-15 DIAGNOSIS — L2082 Flexural eczema: Secondary | ICD-10-CM | POA: Diagnosis not present

## 2020-10-15 DIAGNOSIS — E669 Obesity, unspecified: Secondary | ICD-10-CM | POA: Diagnosis not present

## 2020-10-15 DIAGNOSIS — L219 Seborrheic dermatitis, unspecified: Secondary | ICD-10-CM | POA: Diagnosis not present

## 2020-10-15 DIAGNOSIS — M545 Low back pain, unspecified: Secondary | ICD-10-CM | POA: Diagnosis not present

## 2020-10-15 DIAGNOSIS — Z7689 Persons encountering health services in other specified circumstances: Secondary | ICD-10-CM | POA: Diagnosis not present

## 2020-10-15 MED ORDER — TRIAMCINOLONE ACETONIDE 0.1 % EX CREA: 1.0000 "application " | TOPICAL_CREAM | Freq: Two times a day (BID) | CUTANEOUS | 0 refills | Status: DC

## 2020-10-15 MED ORDER — KETOCONAZOLE 2 % EX SHAM: 1.0000 "application " | MEDICATED_SHAMPOO | CUTANEOUS | 0 refills | Status: DC

## 2020-10-15 NOTE — Assessment & Plan Note (Signed)
Prescribed Kenalog

## 2020-10-15 NOTE — Patient Instructions (Addendum)
Seborrheic Dermatitis, Adult Seborrheic dermatitis is a skin disease that causes red, scaly patches. It usually occurs on the scalp, and it is often called dandruff. The patches may appear on other parts of the body. Skin patches tend to appear where there are many oil glands in the skin. Areas of the body that are commonly affected include the:  Scalp.  Ears.  Eyebrows.  Face.  Bearded area of men's faces.  Skin folds of the body, such as the armpits, groin, and buttocks.  Chest. The condition may come and go for no known reason, and it is often long-lasting (chronic). What are the causes? The cause of this condition is not known. What increases the risk? The following factors may make you more likely to develop this condition:  Having certain conditions, such as: ? HIV (human immunodeficiency virus). ? AIDS (acquired immunodeficiency syndrome). ? Parkinson's disease. ? Mood disorders, such as depression.  Being 40-60 years old. What are the signs or symptoms? Symptoms of this condition include:  Thick scales on the scalp.  Redness on the face or in the armpits.  Skin that is flaky. The flakes may be white or yellow.  Skin that seems oily or dry but is not helped with moisturizers.  Itching or burning in the affected areas.   How is this diagnosed? This condition is diagnosed with a medical history and physical exam. A sample of your skin may be tested (skin biopsy). You may need to see a skin specialist (dermatologist). How is this treated? There is no cure for this condition, but treatment can help to manage the symptoms. You may get treatment to remove scales, lower the risk of skin infection, and reduce swelling or itching. Treatment may include:  Creams that reduce skin yeast.  Medicated shampoo.  Moisturizing creams or ointments.  Creams that reduce swelling and irritation (steroids). Follow these instructions at home:  Apply over-the-counter and  prescription medicines only as told by your health care provider.  Use any medicated shampoo, skin creams, or ointments only as told by your health care provider.  Keep all follow-up visits as told by your health care provider. This is important. Contact a health care provider if:  Your symptoms do not improve with treatment.  Your symptoms get worse.  You have new symptoms. Get help right away if:  Your condition rapidly worsens with treatment. Summary  Seborrheic dermatitis is a skin disease that causes red, scaly patches.  Seborrheic dermatitis commonly affects the scalp, face, and skin folds.  There is no cure for this condition, but treatment can help to manage the symptoms. This information is not intended to replace advice given to you by your health care provider. Make sure you discuss any questions you have with your health care provider. Document Revised: 04/24/2019 Document Reviewed: 04/24/2019 Elsevier Patient Education  2021 [employer] Inc.  

## 2020-10-15 NOTE — Assessment & Plan Note (Signed)
Likely DDD/herniated disc, although has not had imaging yet Has radiating pain to left buttocks Advised to perform simple back exercises, material provided Back brace Tylenol PRN If persistent, will obtain imaging

## 2020-10-15 NOTE — Assessment & Plan Note (Signed)
Diet modification and moderate exercise/walking at least 150 mins/week

## 2020-10-15 NOTE — Assessment & Plan Note (Signed)
Scalp and left ear itching with whitish scales Ketoconazole shampoo

## 2020-10-15 NOTE — Assessment & Plan Note (Signed)
Care established Previous chart reviewed History and medications reviewed with the patient 

## 2020-10-15 NOTE — Progress Notes (Signed)
New Patient Office Visit  Subjective:  Patient ID: Penny Mcguire, female    DOB: 1955/08/18  Age: 65 y.o. MRN: [id number]  CC:  Chief Complaint  Patient presents with  . New Patient (Initial Visit)    New patient has been having lower back pain and hip pain for about 6-8 months also seasonal allergies and psoriasis has gotten worse especially scalp and breast area    HPI Penny Mcguire is a 65 year old female with PMH of eczema, chronic low back pain and obesity who presents for establishing care. She is a former patient of Dr Buelah Manis.  She has been doing well overall. Her BP was elevated initially, but improved later during the visit.  She has been having rash over her scalp, hair line over her forehead and left ear, which is itchy and she has noticed while scales. She tried OTC shampoo, but did not help.  She also has erythematous rash over her b/l arms and underneath the breasts. She reports h/o seasonal allergies and takes Claritin PRN.  She is up-to-date with COVID and flu vaccine.  Past Medical History:  Diagnosis Date  . Fibroid uterus     Past Surgical History:  Procedure Laterality Date  . CESAREAN SECTION     2  . CHOLECYSTECTOMY      Family History  Problem Relation Age of Onset  . Hyperlipidemia Mother   . Hypertension Mother   . Hyperlipidemia Sister   . Diabetes Brother     Social History   Socioeconomic History  . Marital status: Married    Spouse name: Not on file  . Number of children: Not on file  . Years of education: Not on file  . Highest education level: Not on file  Occupational History  . Not on file  Tobacco Use  . Smoking status: Never Smoker  . Smokeless tobacco: Never Used  Substance and Sexual Activity  . Alcohol use: No  . Drug use: No  . Sexual activity: Yes  Other Topics Concern  . Not on file  Social History Narrative  . Not on file   Social Determinants of Health   Financial Resource Strain: Not on file  Food  Insecurity: Not on file  Transportation Needs: Not on file  Physical Activity: Not on file  Stress: Not on file  Social Connections: Not on file  Intimate Partner Violence: Not on file    ROS Review of Systems  Constitutional: Negative for chills and fever.  HENT: Negative for congestion, sinus pressure, sinus pain and sore throat.   Eyes: Negative for pain and discharge.  Respiratory: Negative for cough and shortness of breath.   Cardiovascular: Negative for chest pain and palpitations.  Gastrointestinal: Negative for abdominal pain, constipation, diarrhea, nausea and vomiting.  Endocrine: Negative for polydipsia and polyuria.  Genitourinary: Negative for dysuria and hematuria.  Musculoskeletal: Negative for neck pain and neck stiffness.  Skin: Positive for rash.  Neurological: Negative for dizziness and weakness.  Psychiatric/Behavioral: Negative for agitation and behavioral problems.    Objective:   Today's Vitals: BP 131/84 (BP Location: Left Arm, Patient Position: Sitting, Cuff Size: Normal)   Pulse 74   Resp 18   Ht '5\' 5"'  (1.651 m)   Wt 237 lb 1.9 oz (107.6 kg)   SpO2 98%   BMI 39.46 kg/m   Physical Exam Vitals reviewed.  Constitutional:      General: She is not in acute distress.    Appearance: She is not  diaphoretic.  HENT:     Head: Normocephalic and atraumatic.     Nose: Nose normal.     Mouth/Throat:     Mouth: Mucous membranes are moist.  Eyes:     General: No scleral icterus.    Extraocular Movements: Extraocular movements intact.     Pupils: Pupils are equal, round, and reactive to light.  Cardiovascular:     Rate and Rhythm: Normal rate and regular rhythm.     Pulses: Normal pulses.     Heart sounds: Normal heart sounds. No murmur heard.   Pulmonary:     Breath sounds: Normal breath sounds. No wheezing or rales.  Abdominal:     Palpations: Abdomen is soft.     Tenderness: There is no abdominal tenderness.  Musculoskeletal:     Cervical back:  Neck supple. No tenderness.     Right lower leg: No edema.     Left lower leg: No edema.  Skin:    General: Skin is warm.     Findings: No rash.  Neurological:     General: No focal deficit present.     Mental Status: She is alert and oriented to person, place, and time.  Psychiatric:        Mood and Affect: Mood normal.        Behavior: Behavior normal.     Assessment & Plan:   Problem List Items Addressed This Visit      Encounter to establish care - Primary   Care established Previous chart reviewed History and medications reviewed with the patient     Relevant Orders  CBC  CMP14+EGFR  Lipid panel  Hemoglobin A1c  TSH  Vitamin D (25 hydroxy)    Musculoskeletal and Integument   Eczema    Prescribed Kenalog      Relevant Medications   triamcinolone (KENALOG) 0.1 %   Seborrheic dermatitis of scalp    Scalp and left ear itching with whitish scales Ketoconazole shampoo      Relevant Medications   ketoconazole (NIZORAL) 2 % shampoo (Start on 10/18/2020)     Other   Obesity, unspecified    Diet modification and moderate exercise/walking at least 150 mins/week         Chronic low back pain    Likely DDD/herniated disc, although has not had imaging yet Has radiating pain to left buttocks Advised to perform simple back exercises, material provided Back brace Tylenol PRN If persistent, will obtain imaging       Other Visit Diagnoses    Routine cervical smear       Relevant Orders   Ambulatory referral to Obstetrics / Gynecology      Outpatient Encounter Medications as of 10/15/2020  Medication Sig  . [START ON 10/18/2020] ketoconazole (NIZORAL) 2 % shampoo Apply 1 application topically 2 (two) times a week.  . loratadine (CLARITIN) 10 MG tablet Take 10 mg by mouth daily.  Marland Kitchen triamcinolone (KENALOG) 0.1 % Apply 1 application topically 2 (two) times daily.  Marland Kitchen albuterol (PROVENTIL HFA;VENTOLIN HFA) 108 (90 Base) MCG/ACT inhaler Inhale 2 puffs into the  lungs every 6 (six) hours as needed for wheezing or shortness of breath. (Patient not taking: No sig reported)  . nystatin (MYCOSTATIN/NYSTOP) powder Apply topically 4 (four) times daily. (Patient not taking: Reported on 10/15/2020)  . [DISCONTINUED] fluconazole (DIFLUCAN) 150 MG tablet Take 1 tablet and repeat in 3 days (Patient not taking: Reported on 10/15/2020)  . [DISCONTINUED] Olopatadine HCl 0.2 % SOLN 1 drop  to affected eye daily prn (Patient not taking: Reported on 10/15/2020)   No facility-administered encounter medications on file as of 10/15/2020.    Follow-up: Return in about 4 months (around 02/14/2021) for Annual physical.   Lindell Spar, MD

## 2020-10-18 MED FILL — KETOCONAZOLE 2 % SHAM: 2 | 30 days supply | Qty: 120 | Fill #0

## 2020-10-18 MED FILL — TRIAMCINOLONE 0.1% CREAM: 0.1 | 15 days supply | Qty: 30 | Fill #0

## 2020-10-30 ENCOUNTER — Other Ambulatory Visit (HOSPITAL_COMMUNITY): Payer: Self-pay

## 2021-01-14 ENCOUNTER — Other Ambulatory Visit (HOSPITAL_COMMUNITY)
Admission: RE | Admit: 2021-01-14 | Discharge: 2021-01-14 | Disposition: A | Discharge status: Home / Self Care | Payer: 59 | Source: Ambulatory Visit | Attending: Adult Health | Admitting: Adult Health

## 2021-01-14 ENCOUNTER — Other Ambulatory Visit: Payer: Self-pay

## 2021-01-14 ENCOUNTER — Encounter: Payer: Self-pay | Admitting: Adult Health

## 2021-01-14 ENCOUNTER — Ambulatory Visit (INDEPENDENT_AMBULATORY_CARE_PROVIDER_SITE_OTHER): Payer: 59 | Admitting: Adult Health

## 2021-01-14 VITALS — BP 128/79 | HR 69 | Ht 62.5 in | Wt 233.5 lb

## 2021-01-14 DIAGNOSIS — L309 Dermatitis, unspecified: Secondary | ICD-10-CM | POA: Diagnosis not present

## 2021-01-14 DIAGNOSIS — Z01419 Encounter for gynecological examination (general) (routine) without abnormal findings: Secondary | ICD-10-CM | POA: Insufficient documentation

## 2021-01-14 DIAGNOSIS — Z780 Asymptomatic menopausal state: Secondary | ICD-10-CM

## 2021-01-14 DIAGNOSIS — Z1211 Encounter for screening for malignant neoplasm of colon: Secondary | ICD-10-CM

## 2021-01-14 LAB — HEMOCCULT GUIAC POC 1CARD (OFFICE): Fecal Occult Blood, POC: NEGATIVE

## 2021-01-14 NOTE — Progress Notes (Signed)
Patient ID: Penny Mcguire, female   DOB: 1956/04/02, 65 y.o.   MRN: [id number] History of Present Illness: Penny Mcguire is a 65 year old white female,married, PM in for pelvic and pap and breast exam. PCP is Dr Posey Pronto.   Current Medications, Allergies, Past Medical History, Past Surgical History, Family History and Social History were reviewed in [employer] record.     Review of Systems:  Patient denies any headaches, hearing loss, fatigue, blurred vision, shortness of breath, chest pain, abdominal pain, problems with bowel movements, urination,(has some UI) or intercourse. No joint pain or mood swings.  Denies any vaginal bleeding   Physical Exam:BP 128/79 (BP Location: Left Arm, Patient Position: Sitting, Cuff Size: Large)   Pulse 69   Ht 5' 2.5" (1.588 m)   Wt 233 lb 8 oz (105.9 kg)   BMI 42.03 kg/m   General:  Well developed, well nourished, no acute distress Skin:  Warm and dry,has eczema  Neck:  Midline trachea, normal thyroid, good ROM, no lymphadenopathy Lungs; Clear to auscultation bilaterally Breast:  No dominant palpable mass, retraction, or nipple discharge, eczema patch between breasts  Cardiovascular: Regular rate and rhythm Abdomen:  Soft, non tender, no hepatosplenomegaly Pelvic:  External genitalia is normal in appearance, eczema on labia.  The vagina is normal in appearance. Urethra has no lesions or masses. The cervix is smooth, pap with HR HPV genotyping performed.  Uterus is felt to be normal size, shape, and contour.  No adnexal masses or tenderness noted.Bladder is non tender, no masses felt. Rectal: Good sphincter tone, no polyps, or hemorrhoids felt.  Hemoccult negative. Extremities/musculoskeletal:  No swelling or varicosities noted, no clubbing or cyanosis Psych:  No mood changes, alert and cooperative,seems happy AA is 0  Fall risk is low Depression screen [facility] 2/9 01/14/2021 10/15/2020 10/23/2017  Decreased Interest 0 0 0  Down, Depressed,  Hopeless 0 0 0  PHQ - 2 Score 0 0 0  Altered sleeping 0 - -  Tired, decreased energy 1 - -  Change in appetite 0 - -  Feeling bad or failure about yourself  0 - -  Trouble concentrating 0 - -  Moving slowly or fidgety/restless 0 - -  Suicidal thoughts 0 - -  PHQ-9 Score 1 - -    GAD 7 : Generalized Anxiety Score 01/14/2021  Nervous, Anxious, on Edge 0  Control/stop worrying 1  Worry too much - different things 1  Trouble relaxing 0  Restless 0  Easily annoyed or irritable 0  Afraid - awful might happen 0  Total GAD 7 Score 2      Upstream - 01/14/21 1005       Pregnancy Intention Screening   Does the patient want to become pregnant in the next year? N/A    Does the patient's partner want to become pregnant in the next year? N/A    Would the patient like to discuss contraceptive options today? N/A      Contraception Wrap Up   Current Method No Method - Other Reason   postmenopausal   End Method No Method - Other Reason   postmenopausal   Contraception Counseling Provided No             Examination chaperoned by Levy Pupa LPN Impression and Plan: 1. Encounter for gynecological examination with Papanicolaou smear of cervix Pap sent Pap in 3 years if normal Physical in 1 year with PCP Labs with PCP Mammogram yearly Colonoscopy advised  2. Encounter for screening fecal occult blood testing  3. Postmenopausal   4. Eczema, unspecified type Has cream

## 2021-01-18 LAB — CYTOLOGY - PAP
Comment: NEGATIVE
Diagnosis: NEGATIVE
High risk HPV: NEGATIVE

## 2021-01-19 ENCOUNTER — Telehealth: Payer: Self-pay | Admitting: Adult Health

## 2021-01-19 NOTE — Telephone Encounter (Signed)
Pt aware that pap was negative for malignancy and HPV, so next pap in 3 years

## 2021-01-21 ENCOUNTER — Other Ambulatory Visit: Payer: Self-pay

## 2021-01-21 ENCOUNTER — Other Ambulatory Visit (HOSPITAL_COMMUNITY)
Admission: RE | Admit: 2021-01-21 | Discharge: 2021-01-21 | Disposition: A | Discharge status: Home / Self Care | Payer: 59 | Source: Ambulatory Visit | Attending: Internal Medicine | Admitting: Internal Medicine

## 2021-01-21 DIAGNOSIS — Z4689 Encounter for fitting and adjustment of other specified devices: Secondary | ICD-10-CM | POA: Insufficient documentation

## 2021-01-21 LAB — COMPREHENSIVE METABOLIC PANEL
ALT: 34 U/L (ref 0–44)
AST: 25 U/L (ref 15–41)
Albumin: 3.9 g/dL (ref 3.5–5.0)
Alkaline Phosphatase: 82 U/L (ref 38–126)
Anion gap: 8 (ref 5–15)
BUN: 16 mg/dL (ref 8–23)
CO2: 27 mmol/L (ref 22–32)
Calcium: 9.1 mg/dL (ref 8.9–10.3)
Chloride: 103 mmol/L (ref 98–111)
Creatinine, Ser: 0.73 mg/dL (ref 0.44–1.00)
GFR, Estimated: >60 mL/min (ref >60)
Glucose, Bld: 108 mg/dL — ABNORMAL HIGH (ref 70–99)
Potassium: 4.1 mmol/L (ref 3.5–5.1)
Sodium: 138 mmol/L (ref 135–145)
Total Bilirubin: 0.5 mg/dL (ref 0.3–1.2)
Total Protein: 7.1 g/dL (ref 6.5–8.1)

## 2021-01-21 LAB — CBC
HCT: 43 % (ref 36.0–46.0)
Hemoglobin: 13.6 g/dL (ref 12.0–15.0)
MCH: 29.4 pg (ref 26.0–34.0)
MCHC: 31.6 g/dL (ref 30.0–36.0)
MCV: 92.9 fL (ref 80.0–100.0)
Platelets: 266 10*3/uL (ref 150–400)
RBC: 4.63 MIL/uL (ref 3.87–5.11)
RDW: 14 % (ref 11.5–15.5)
WBC: 5.8 10*3/uL (ref 4.0–10.5)
nRBC: 0 % (ref 0.0–0.2)

## 2021-01-21 LAB — LIPID PANEL
Cholesterol: 168 mg/dL (ref 0–200)
HDL: 59 mg/dL (ref >40)
LDL Cholesterol: 92 mg/dL (ref 0–99)
Total CHOL/HDL Ratio: 2.8 RATIO
Triglycerides: 84 mg/dL (ref <150)
VLDL: 17 mg/dL (ref 0–40)

## 2021-01-21 LAB — TSH: TSH: 2.608 u[IU]/mL (ref 0.350–4.500)

## 2021-01-21 LAB — HEMOGLOBIN A1C
Hgb A1c MFr Bld: 5.4 % (ref 4.8–5.6)
Mean Plasma Glucose: 108.28 mg/dL

## 2021-01-21 LAB — VITAMIN D 25 HYDROXY (VIT D DEFICIENCY, FRACTURES): Vit D, 25-Hydroxy: 21.11 ng/mL — ABNORMAL LOW (ref 30–100)

## 2021-02-18 ENCOUNTER — Encounter: Payer: 59 | Admitting: Internal Medicine

## 2021-03-25 ENCOUNTER — Other Ambulatory Visit: Payer: Self-pay

## 2021-03-25 ENCOUNTER — Encounter: Payer: Self-pay | Admitting: Internal Medicine

## 2021-03-25 ENCOUNTER — Ambulatory Visit (INDEPENDENT_AMBULATORY_CARE_PROVIDER_SITE_OTHER): Payer: 59 | Admitting: Internal Medicine

## 2021-03-25 ENCOUNTER — Other Ambulatory Visit (HOSPITAL_COMMUNITY): Payer: Self-pay

## 2021-03-25 VITALS — BP 131/79 | HR 73 | Temp 98.5°F | Resp 20 | Ht 65.0 in | Wt 228.0 lb

## 2021-03-25 DIAGNOSIS — E559 Vitamin D deficiency, unspecified: Secondary | ICD-10-CM

## 2021-03-25 DIAGNOSIS — L219 Seborrheic dermatitis, unspecified: Secondary | ICD-10-CM | POA: Diagnosis not present

## 2021-03-25 DIAGNOSIS — L309 Dermatitis, unspecified: Secondary | ICD-10-CM

## 2021-03-25 DIAGNOSIS — Z23 Encounter for immunization: Secondary | ICD-10-CM | POA: Diagnosis not present

## 2021-03-25 MED ORDER — VITAMIN D (ERGOCALCIFEROL) 1.25 MG (50000 UNIT) PO CAPS
50000.0000 [IU] | ORAL_CAPSULE | ORAL | 5 refills | Status: DC
Start: 2021-03-25 — End: 2022-02-24
  Filled 2021-03-25: qty 5, 35d supply, fill #0
  Filled 2021-08-26: qty 5, 35d supply, fill #1
  Filled 2021-08-26: qty 12, 84d supply, fill #1
  Filled 2021-11-16: qty 12, 84d supply, fill #2

## 2021-03-25 NOTE — Assessment & Plan Note (Signed)
Last vitamin D Lab Results  Component Value Date   VD25OH 21.11 (L) 01/21/2021   Started Vitamin D 50,000 qw

## 2021-03-25 NOTE — Patient Instructions (Signed)
Health Maintenance, Female Adopting a healthy lifestyle and getting preventive care are important in promoting health and wellness. Ask your health care provider about: The right schedule for you to have regular tests and exams. Things you can do on your own to prevent diseases and keep yourself healthy. What should I know about diet, weight, and exercise? Eat a healthy diet  Eat a diet that includes plenty of vegetables, fruits, low-fat dairy products, and lean protein. Do not eat a lot of foods that are high in solid fats, added sugars, or sodium.  Maintain a healthy weight Body mass index (BMI) is used to identify weight problems. It estimates body fat based on height and weight. Your health care provider can help determineyour BMI and help you achieve or maintain a healthy weight. Get regular exercise Get regular exercise. This is one of the most important things you can do for your health. Most adults should: Exercise for at least 150 minutes each week. The exercise should increase your heart rate and make you sweat (moderate-intensity exercise). Do strengthening exercises at least twice a week. This is in addition to the moderate-intensity exercise. Spend less time sitting. Even light physical activity can be beneficial. Watch cholesterol and blood lipids Have your blood tested for lipids and cholesterol at 63 years of age, then havethis test every 5 years. Have your cholesterol levels checked more often if: Your lipid or cholesterol levels are high. You are older than 63 years of age. You are at high risk for heart disease. What should I know about cancer screening? Depending on your health history and family history, you may need to have cancer screening at various ages. This may include screening for: Breast cancer. Cervical cancer. Colorectal cancer. Skin cancer. Lung cancer. What should I know about heart disease, diabetes, and high blood pressure? Blood pressure and heart  disease High blood pressure causes heart disease and increases the risk of stroke. This is more likely to develop in people who have high blood pressure readings, are of African descent, or are overweight. Have your blood pressure checked: Every 3-5 years if you are 18-39 years of age. Every year if you are 40 years old or older. Diabetes Have regular diabetes screenings. This checks your fasting blood sugar level. Have the screening done: Once every three years after age 40 if you are at a normal weight and have a low risk for diabetes. More often and at a younger age if you are overweight or have a high risk for diabetes. What should I know about preventing infection? Hepatitis B If you have a higher risk for hepatitis B, you should be screened for this virus. Talk with your health care provider to find out if you are at risk forhepatitis B infection. Hepatitis C Testing is recommended for: Everyone born from 1945 through 1965. Anyone with known risk factors for hepatitis C. Sexually transmitted infections (STIs) Get screened for STIs, including gonorrhea and chlamydia, if: You are sexually active and are younger than 63 years of age. You are older than 63 years of age and your health care provider tells you that you are at risk for this type of infection. Your sexual activity has changed since you were last screened, and you are at increased risk for chlamydia or gonorrhea. Ask your health care provider if you are at risk. Ask your health care provider about whether you are at high risk for HIV. Your health care provider may recommend a prescription medicine to help   prevent HIV infection. If you choose to take medicine to prevent HIV, you should first get tested for HIV. You should then be tested every 3 months for as long as you are taking the medicine. Pregnancy If you are about to stop having your period (premenopausal) and you may become pregnant, seek counseling before you get  pregnant. Take 400 to 800 micrograms (mcg) of folic acid every day if you become pregnant. Ask for birth control (contraception) if you want to prevent pregnancy. Osteoporosis and menopause Osteoporosis is a disease in which the bones lose minerals and strength with aging. This can result in bone fractures. If you are 65 years old or older, or if you are at risk for osteoporosis and fractures, ask your health care provider if you should: Be screened for bone loss. Take a calcium or vitamin D supplement to lower your risk of fractures. Be given hormone replacement therapy (HRT) to treat symptoms of menopause. Follow these instructions at home: Lifestyle Do not use any products that contain nicotine or tobacco, such as cigarettes, e-cigarettes, and chewing tobacco. If you need help quitting, ask your health care provider. Do not use street drugs. Do not share needles. Ask your health care provider for help if you need support or information about quitting drugs. Alcohol use Do not drink alcohol if: Your health care provider tells you not to drink. You are pregnant, may be pregnant, or are planning to become pregnant. If you drink alcohol: Limit how much you use to 0-1 drink a day. Limit intake if you are breastfeeding. Be aware of how much alcohol is in your drink. In the [place] one drink equals one 12 oz bottle of beer (355 mL), one 5 oz glass of wine (148 mL), or one 1 oz glass of hard liquor (44 mL). General instructions Schedule regular health, dental, and eye exams. Stay current with your vaccines. Tell your health care provider if: You often feel depressed. You have ever been abused or do not feel safe at home. Summary Adopting a healthy lifestyle and getting preventive care are important in promoting health and wellness. Follow your health care provider's instructions about healthy diet, exercising, and getting tested or screened for diseases. Follow your health care provider's  instructions on monitoring your cholesterol and blood pressure. This information is not intended to replace advice given to you by your health care provider. Make sure you discuss any questions you have with your healthcare provider. Document Revised: 07/10/2018 Document Reviewed: 07/10/2018 Elsevier Patient Education  2022 Elsevier Inc[employer]  

## 2021-03-25 NOTE — Progress Notes (Signed)
Established Patient Office Visit  Subjective:  Patient ID: Penny Mcguire, female    DOB: [birth year]  Age: 64 y.o. MRN: [id number]  CC:  Chief Complaint  Patient presents with   Rash    HPI Penny Mcguire is a 65 year old female with PMH of eczema, chronic low back pain and obesity who presents for follow up of her rash.  She has been doing well overall.  Her rash on scalp has improved now.  She has been using baby oil every other week to help with the dryness over the scalp.  She had Pap smear and negative fit test at OB/GYN office.  She is up-to-date with COVID vaccination.  She will get flu vaccine at the hospital.  She received first dose of Shingrix vaccine in the office today.  Past Medical History:  Diagnosis Date   Fibroid uterus     Past Surgical History:  Procedure Laterality Date   CESAREAN SECTION     2   CHOLECYSTECTOMY      Family History  Problem Relation Age of Onset   Dementia Maternal Grandmother    Hyperlipidemia Mother    Hypertension Mother    Dementia Mother    Diabetes Brother    Hyperlipidemia Sister     Social History   Socioeconomic History   Marital status: Married    Spouse name: Not on file   Number of children: Not on file   Years of education: Not on file   Highest education level: Not on file  Occupational History   Not on file  Tobacco Use   Smoking status: Never   Smokeless tobacco: Never  Vaping Use   Vaping Use: Never used  Substance and Sexual Activity   Alcohol use: No   Drug use: No   Sexual activity: Yes    Birth control/protection: Post-menopausal  Other Topics Concern   Not on file  Social History Narrative   Not on file   Social Determinants of Health   Financial Resource Strain: Low Risk    Difficulty of Paying Living Expenses: Not hard at all  Food Insecurity: No Food Insecurity   Worried About Charity fundraiser in the Last Year: Never true   [place] in the Last Year: Never true   Transportation Needs: No Transportation Needs   Lack of Transportation (Medical): No   Lack of Transportation (Non-Medical): No  Physical Activity: Insufficiently Active   Days of Exercise per Week: 2 days   Minutes of Exercise per Session: 20 min  Stress: No Stress Concern Present   Feeling of Stress : Only a little  Social Connections: Moderately Integrated   Frequency of Communication with Friends and Family: Twice a week   Frequency of Social Gatherings with Friends and Family: Once a week   Attends Religious Services: More than 4 times per year   Active Member of [employer] or Organizations: No   Attends Archivist Meetings: Never   Marital Status: Married  Human resources officer Violence: Not At Risk   Fear of Current or Ex-Partner: No   Emotionally Abused: No   Physically Abused: No   Sexually Abused: No    Outpatient Medications Prior to Visit  Medication Sig Dispense Refill   ketoconazole (NIZORAL) 2 % shampoo Apply 1 application topically 2 (two) times a week. (Patient taking differently: Apply 1 application topically as needed.) 120 mL 0   loratadine (CLARITIN) 10 MG tablet Take 10 mg by mouth daily.  Naproxen Sodium (ALEVE PO) Take by mouth.     triamcinolone (KENALOG) 0.1 % Apply 1 application topically 2 (two) times daily. 30 g 0   TURMERIC PO Take by mouth.     No facility-administered medications prior to visit.    No Known Allergies  ROS Review of Systems  Constitutional:  Negative for chills and fever.  HENT:  Negative for congestion, sinus pressure, sinus pain and sore throat.   Eyes:  Negative for pain and discharge.  Respiratory:  Negative for cough and shortness of breath.   Cardiovascular:  Negative for chest pain and palpitations.  Gastrointestinal:  Negative for abdominal pain, constipation, diarrhea, nausea and vomiting.  Endocrine: Negative for polydipsia and polyuria.  Genitourinary:  Negative for dysuria and hematuria.  Musculoskeletal:   Negative for neck pain and neck stiffness.  Skin:  Negative for rash.  Neurological:  Negative for dizziness and weakness.  Psychiatric/Behavioral:  Negative for agitation and behavioral problems.      Objective:    Physical Exam Vitals reviewed.  Constitutional:      General: She is not in acute distress.    Appearance: She is not diaphoretic.  HENT:     Head: Normocephalic and atraumatic.     Nose: Nose normal.     Mouth/Throat:     Mouth: Mucous membranes are moist.  Eyes:     General: No scleral icterus.    Extraocular Movements: Extraocular movements intact.  Cardiovascular:     Rate and Rhythm: Normal rate and regular rhythm.     Pulses: Normal pulses.     Heart sounds: Normal heart sounds. No murmur heard. Pulmonary:     Breath sounds: Normal breath sounds. No wheezing or rales.  Abdominal:     Palpations: Abdomen is soft.     Tenderness: There is no abdominal tenderness.  Musculoskeletal:     Cervical back: Neck supple. No tenderness.     Right lower leg: No edema.     Left lower leg: No edema.  Skin:    General: Skin is warm.     Findings: No rash.  Neurological:     General: No focal deficit present.     Mental Status: She is alert and oriented to person, place, and time.     Cranial Nerves: No cranial nerve deficit.     Sensory: No sensory deficit.     Motor: No weakness.  Psychiatric:        Mood and Affect: Mood normal.        Behavior: Behavior normal.    BP 131/79 (BP Location: Right Arm, Patient Position: Sitting, Cuff Size: Large)   Pulse 73   Temp 98.5 F (36.9 C)   Resp 20   Ht '5\' 5"'$  (1.651 m)   Wt 228 lb (103.4 kg)   SpO2 94%   BMI 37.94 kg/m  Wt Readings from Last 3 Encounters:  03/25/21 228 lb (103.4 kg)  01/14/21 233 lb 8 oz (105.9 kg)  10/15/20 237 lb 1.9 oz (107.6 kg)     Health Maintenance Due  Topic Date Due   HIV Screening  Never done   Hepatitis C Screening  Never done   TETANUS/TDAP  07/31/2018   INFLUENZA VACCINE   02/28/2021    There are no preventive care reminders to display for this patient.  Lab Results  Component Value Date   TSH 2.608 01/21/2021   Lab Results  Component Value Date   WBC 5.8 01/21/2021   HGB  13.6 01/21/2021   HCT 43.0 01/21/2021   MCV 92.9 01/21/2021   PLT 266 01/21/2021   Lab Results  Component Value Date   NA 138 01/21/2021   K 4.1 01/21/2021   CO2 27 01/21/2021   GLUCOSE 108 (H) 01/21/2021   BUN 16 01/21/2021   CREATININE 0.73 01/21/2021   BILITOT 0.5 01/21/2021   ALKPHOS 82 01/21/2021   AST 25 01/21/2021   ALT 34 01/21/2021   PROT 7.1 01/21/2021   ALBUMIN 3.9 01/21/2021   CALCIUM 9.1 01/21/2021   ANIONGAP 8 01/21/2021   Lab Results  Component Value Date   CHOL 168 01/21/2021   Lab Results  Component Value Date   HDL 59 01/21/2021   Lab Results  Component Value Date   LDLCALC 92 01/21/2021   Lab Results  Component Value Date   TRIG 84 01/21/2021   Lab Results  Component Value Date   CHOLHDL 2.8 01/21/2021   Lab Results  Component Value Date   HGBA1C 5.4 01/21/2021      Assessment & Plan:   Problem List Items Addressed This Visit       Musculoskeletal and Integument   Eczema    Well-controlled with Kenalog PRN      Seborrheic dermatitis of scalp    Improved with Ketoconazole and oil application now        Other   Vitamin D deficiency - Primary    Last vitamin D Lab Results  Component Value Date   VD25OH 21.11 (L) 01/21/2021  Started Vitamin D 50,000 qw      Relevant Medications   Vitamin D, Ergocalciferol, (DRISDOL) 1.25 MG (50000 UNIT) CAPS capsule   Other Visit Diagnoses     Immunization due       Relevant Orders   Varicella-zoster vaccine IM (Completed)       Meds ordered this encounter  Medications   Vitamin D, Ergocalciferol, (DRISDOL) 1.25 MG (50000 UNIT) CAPS capsule    Sig: Take 1 capsule (50,000 Units total) by mouth every 7 (seven) days.    Dispense:  5 capsule    Refill:  5    Follow-up:  Return in about 5 years (around 03/25/2026) for Second dose of shingrix and Vitamin D deficiency.    Lindell Spar, MD

## 2021-03-25 NOTE — Assessment & Plan Note (Signed)
Well-controlled with Kenalog PRN

## 2021-03-25 NOTE — Assessment & Plan Note (Signed)
Improved with Ketoconazole and oil application now 

## 2021-08-08 ENCOUNTER — Other Ambulatory Visit (HOSPITAL_COMMUNITY): Payer: Self-pay | Admitting: Adult Health

## 2021-08-08 DIAGNOSIS — Z1231 Encounter for screening mammogram for malignant neoplasm of breast: Secondary | ICD-10-CM

## 2021-08-22 ENCOUNTER — Ambulatory Visit (HOSPITAL_COMMUNITY)
Admission: RE | Admit: 2021-08-22 | Discharge: 2021-08-22 | Disposition: A | Discharge status: Home / Self Care | Payer: 59 | Source: Ambulatory Visit | Attending: Adult Health | Admitting: Adult Health

## 2021-08-22 ENCOUNTER — Other Ambulatory Visit: Payer: Self-pay

## 2021-08-22 DIAGNOSIS — Z1231 Encounter for screening mammogram for malignant neoplasm of breast: Secondary | ICD-10-CM | POA: Insufficient documentation

## 2021-08-26 ENCOUNTER — Encounter: Payer: Self-pay | Admitting: Internal Medicine

## 2021-08-26 ENCOUNTER — Other Ambulatory Visit (HOSPITAL_COMMUNITY): Payer: Self-pay

## 2021-08-26 ENCOUNTER — Ambulatory Visit: Payer: 59 | Admitting: Internal Medicine

## 2021-08-26 ENCOUNTER — Other Ambulatory Visit: Payer: Self-pay

## 2021-08-26 VITALS — BP 136/86 | HR 72 | Resp 18 | Ht 65.0 in | Wt 211.0 lb

## 2021-08-26 DIAGNOSIS — E559 Vitamin D deficiency, unspecified: Secondary | ICD-10-CM

## 2021-08-26 DIAGNOSIS — Z1159 Encounter for screening for other viral diseases: Secondary | ICD-10-CM | POA: Diagnosis not present

## 2021-08-26 DIAGNOSIS — E669 Obesity, unspecified: Secondary | ICD-10-CM | POA: Diagnosis not present

## 2021-08-26 DIAGNOSIS — Z0001 Encounter for general adult medical examination with abnormal findings: Secondary | ICD-10-CM

## 2021-08-26 DIAGNOSIS — Z23 Encounter for immunization: Secondary | ICD-10-CM | POA: Diagnosis not present

## 2021-08-26 DIAGNOSIS — Z114 Encounter for screening for human immunodeficiency virus [HIV]: Secondary | ICD-10-CM

## 2021-08-26 NOTE — Assessment & Plan Note (Signed)
Last vitamin D Lab Results  Component Value Date   VD25OH 21.11 (L) 01/21/2021   Advised to restart Vitamin D 50,000 qw

## 2021-08-26 NOTE — Progress Notes (Signed)
Established Patient Office Visit  Subjective:  Patient ID: Penny Mcguire, female    DOB: [birth year]  Age: 66 y.o. MRN: [id number]  CC:  Chief Complaint  Patient presents with   Follow-up    5-6 month follow up with shingrix 2    HPI [name] is a 66 y.o. female with past medical history of eczema, chronic low back pain and obesity who presents for f/u of her chronic medical conditions.  She has been doing well overall. Her rash on scalp has improved now.  She took vitamin D supplement for a month and later did not get her refill.  She agrees to get it refilled.  She has lost about 17 lbs since the last visit with low-carb diet.  She received second dose of Shingrix vaccine in the office today.  Past Medical History:  Diagnosis Date   Fibroid uterus     Past Surgical History:  Procedure Laterality Date   CESAREAN SECTION     2   CHOLECYSTECTOMY      Family History  Problem Relation Age of Onset   Dementia Maternal Grandmother    Hyperlipidemia Mother    Hypertension Mother    Dementia Mother    Diabetes Brother    Hyperlipidemia Sister     Social History   Socioeconomic History   Marital status: Married    Spouse name: Not on file   Number of children: Not on file   Years of education: Not on file   Highest education level: Not on file  Occupational History   Not on file  Tobacco Use   Smoking status: Never   Smokeless tobacco: Never  Vaping Use   Vaping Use: Never used  Substance and Sexual Activity   Alcohol use: No   Drug use: No   Sexual activity: Yes    Birth control/protection: Post-menopausal  Other Topics Concern   Not on file  Social History Narrative   Not on file   Social Determinants of Health   Financial Resource Strain: Low Risk    Difficulty of Paying Living Expenses: Not hard at all  Food Insecurity: No Food Insecurity   Worried About Charity fundraiser in the Last Year: Never true   [place] in the Last  Year: Never true  Transportation Needs: No Transportation Needs   Lack of Transportation (Medical): No   Lack of Transportation (Non-Medical): No  Physical Activity: Insufficiently Active   Days of Exercise per Week: 2 days   Minutes of Exercise per Session: 20 min  Stress: No Stress Concern Present   Feeling of Stress : Only a little  Social Connections: Moderately Integrated   Frequency of Communication with Friends and Family: Twice a week   Frequency of Social Gatherings with Friends and Family: Once a week   Attends Religious Services: More than 4 times per year   Active Member of [employer] or Organizations: No   Attends Archivist Meetings: Never   Marital Status: Married  Human resources officer Violence: Not At Risk   Fear of Current or Ex-Partner: No   Emotionally Abused: No   Physically Abused: No   Sexually Abused: No    Outpatient Medications Prior to Visit  Medication Sig Dispense Refill   ketoconazole (NIZORAL) 2 % shampoo Apply 1 application topically 2 (two) times a week. (Patient taking differently: Apply 1 application topically as needed.) 120 mL 0   loratadine (CLARITIN) 10 MG tablet Take 10  mg by mouth daily.     Naproxen Sodium (ALEVE PO) Take by mouth.     triamcinolone (KENALOG) 0.1 % Apply 1 application topically 2 (two) times daily. 30 g 0   TURMERIC PO Take by mouth.     Vitamin D, Ergocalciferol, (DRISDOL) 1.25 MG (50000 UNIT) CAPS capsule Take 1 capsule (50,000 Units total) by mouth every 7 (seven) days. 5 capsule 5   No facility-administered medications prior to visit.    No Known Allergies  ROS Review of Systems  Constitutional:  Negative for chills and fever.  HENT:  Negative for congestion, sinus pressure, sinus pain and sore throat.   Eyes:  Negative for pain and discharge.  Respiratory:  Negative for cough and shortness of breath.   Cardiovascular:  Negative for chest pain and palpitations.  Gastrointestinal:  Negative for abdominal pain,  constipation, diarrhea, nausea and vomiting.  Endocrine: Negative for polydipsia and polyuria.  Genitourinary:  Negative for dysuria and hematuria.  Musculoskeletal:  Negative for neck pain and neck stiffness.  Skin:  Negative for rash.  Neurological:  Negative for dizziness and weakness.  Psychiatric/Behavioral:  Negative for agitation and behavioral problems.      Objective:    Physical Exam Vitals reviewed.  Constitutional:      General: She is not in acute distress.    Appearance: She is not diaphoretic.  HENT:     Head: Normocephalic and atraumatic.     Nose: Nose normal.     Mouth/Throat:     Mouth: Mucous membranes are moist.  Eyes:     General: No scleral icterus.    Extraocular Movements: Extraocular movements intact.  Cardiovascular:     Rate and Rhythm: Normal rate and regular rhythm.     Pulses: Normal pulses.     Heart sounds: Normal heart sounds. No murmur heard. Pulmonary:     Breath sounds: Normal breath sounds. No wheezing or rales.  Musculoskeletal:     Cervical back: Neck supple. No tenderness.     Right lower leg: No edema.     Left lower leg: No edema.  Skin:    General: Skin is warm.     Findings: No rash.  Neurological:     General: No focal deficit present.     Mental Status: She is alert and oriented to person, place, and time.     Cranial Nerves: No cranial nerve deficit.     Sensory: No sensory deficit.     Motor: No weakness.  Psychiatric:        Mood and Affect: Mood normal.        Behavior: Behavior normal.    BP 136/86 (BP Location: Left Arm, Patient Position: Sitting, Cuff Size: Normal)    Pulse 72    Resp 18    Ht 5\' 5"  (1.651 m)    Wt 211 lb (95.7 kg)    SpO2 100%    BMI 35.11 kg/m  Wt Readings from Last 3 Encounters:  08/26/21 211 lb (95.7 kg)  03/25/21 228 lb (103.4 kg)  01/14/21 233 lb 8 oz (105.9 kg)    Lab Results  Component Value Date   TSH 2.608 01/21/2021   Lab Results  Component Value Date   WBC 5.8 01/21/2021    HGB 13.6 01/21/2021   HCT 43.0 01/21/2021   MCV 92.9 01/21/2021   PLT 266 01/21/2021   Lab Results  Component Value Date   NA 138 01/21/2021   K 4.1 01/21/2021  CO2 27 01/21/2021   GLUCOSE 108 (H) 01/21/2021   BUN 16 01/21/2021   CREATININE 0.73 01/21/2021   BILITOT 0.5 01/21/2021   ALKPHOS 82 01/21/2021   AST 25 01/21/2021   ALT 34 01/21/2021   PROT 7.1 01/21/2021   ALBUMIN 3.9 01/21/2021   CALCIUM 9.1 01/21/2021   ANIONGAP 8 01/21/2021   Lab Results  Component Value Date   CHOL 168 01/21/2021   Lab Results  Component Value Date   HDL 59 01/21/2021   Lab Results  Component Value Date   LDLCALC 92 01/21/2021   Lab Results  Component Value Date   TRIG 84 01/21/2021   Lab Results  Component Value Date   CHOLHDL 2.8 01/21/2021   Lab Results  Component Value Date   HGBA1C 5.4 01/21/2021      Assessment & Plan:   Problem List Items Addressed This Visit       Other   Obesity, unspecified    Diet modification and moderate exercise/walking at least 150 mins/week Has lost about 17 lbs with low carb diet, encouraged to continue her efforts      Vitamin D deficiency    Last vitamin D Lab Results  Component Value Date   VD25OH 21.11 (L) 01/21/2021  Advised to restart Vitamin D 50,000 qw      Relevant Orders   VITAMIN D 25 Hydroxy (Vit-D Deficiency, Fractures)   Other Visit Diagnoses     Need for varicella vaccine    -  Primary   Relevant Orders   Varicella-zoster vaccine IM (Shingrix) (Completed)   Need for hepatitis C screening test       Relevant Orders   Hepatitis C Antibody   Encounter for screening for HIV       Relevant Orders   HIV antibody (with reflex)       No orders of the defined types were placed in this encounter.   Follow-up: Return in about 6 months (around 02/23/2022) for Annual physical.    Lindell Spar, MD

## 2021-08-26 NOTE — Assessment & Plan Note (Signed)
Diet modification and moderate exercise/walking at least 150 mins/week Has lost about 17 lbs with low carb diet, encouraged to continue her efforts

## 2021-08-26 NOTE — Patient Instructions (Addendum)
Please start taking Vitamin D supplement as prescribed.  Please continue to follow low carb diet and perform moderate exercise/walking at least 150 mins/week.  Please get fasting blood tests done before the next visit.

## 2021-11-16 ENCOUNTER — Other Ambulatory Visit (HOSPITAL_COMMUNITY): Payer: Self-pay

## 2022-02-03 ENCOUNTER — Other Ambulatory Visit (HOSPITAL_COMMUNITY)
Admission: RE | Admit: 2022-02-03 | Discharge: 2022-02-03 | Disposition: A | Discharge status: Home / Self Care | Payer: 59 | Source: Ambulatory Visit | Attending: Internal Medicine | Admitting: Internal Medicine

## 2022-02-03 DIAGNOSIS — E559 Vitamin D deficiency, unspecified: Secondary | ICD-10-CM | POA: Diagnosis not present

## 2022-02-03 LAB — CBC WITH DIFFERENTIAL/PLATELET
Abs Immature Granulocytes: 0.01 10*3/uL (ref 0.00–0.07)
Basophils Absolute: 0 10*3/uL (ref 0.0–0.1)
Basophils Relative: 1 %
Eosinophils Absolute: 0.2 10*3/uL (ref 0.0–0.5)
Eosinophils Relative: 4 %
HCT: 41.6 % (ref 36.0–46.0)
Hemoglobin: 13.6 g/dL (ref 12.0–15.0)
Immature Granulocytes: 0 %
Lymphocytes Relative: 30 %
Lymphs Abs: 1.5 10*3/uL (ref 0.7–4.0)
MCH: 30.2 pg (ref 26.0–34.0)
MCHC: 32.7 g/dL (ref 30.0–36.0)
MCV: 92.4 fL (ref 80.0–100.0)
Monocytes Absolute: 0.4 10*3/uL (ref 0.1–1.0)
Monocytes Relative: 7 %
Neutro Abs: 2.9 10*3/uL (ref 1.7–7.7)
Neutrophils Relative %: 58 %
Platelets: 253 10*3/uL (ref 150–400)
RBC: 4.5 MIL/uL (ref 3.87–5.11)
RDW: 14 % (ref 11.5–15.5)
WBC: 5 10*3/uL (ref 4.0–10.5)
nRBC: 0 % (ref 0.0–0.2)

## 2022-02-03 LAB — HEPATITIS C ANTIBODY: HCV Ab: NONREACTIVE

## 2022-02-03 LAB — COMPREHENSIVE METABOLIC PANEL
ALT: 19 U/L (ref 0–44)
AST: 18 U/L (ref 15–41)
Albumin: 3.8 g/dL (ref 3.5–5.0)
Alkaline Phosphatase: 85 U/L (ref 38–126)
Anion gap: 6 (ref 5–15)
BUN: 13 mg/dL (ref 8–23)
CO2: 28 mmol/L (ref 22–32)
Calcium: 9 mg/dL (ref 8.9–10.3)
Chloride: 104 mmol/L (ref 98–111)
Creatinine, Ser: 0.59 mg/dL (ref 0.44–1.00)
GFR, Estimated: >60 mL/min (ref >60)
Glucose, Bld: 101 mg/dL — ABNORMAL HIGH (ref 70–99)
Potassium: 4.1 mmol/L (ref 3.5–5.1)
Sodium: 138 mmol/L (ref 135–145)
Total Bilirubin: 0.6 mg/dL (ref 0.3–1.2)
Total Protein: 7.4 g/dL (ref 6.5–8.1)

## 2022-02-03 LAB — LIPID PANEL
Cholesterol: 202 mg/dL — ABNORMAL HIGH (ref 0–200)
HDL: 59 mg/dL (ref >40)
LDL Cholesterol: 124 mg/dL — ABNORMAL HIGH (ref 0–99)
Total CHOL/HDL Ratio: 3.4 RATIO
Triglycerides: 97 mg/dL (ref <150)
VLDL: 19 mg/dL (ref 0–40)

## 2022-02-03 LAB — VITAMIN D 25 HYDROXY (VIT D DEFICIENCY, FRACTURES): Vit D, 25-Hydroxy: 40 ng/mL (ref 30–100)

## 2022-02-03 LAB — HEMOGLOBIN A1C
Hgb A1c MFr Bld: 5.5 % (ref 4.8–5.6)
Mean Plasma Glucose: 111.15 mg/dL

## 2022-02-03 LAB — TSH: TSH: 1.852 u[IU]/mL (ref 0.350–4.500)

## 2022-02-03 LAB — HIV ANTIBODY (ROUTINE TESTING W REFLEX): HIV Screen 4th Generation wRfx: NONREACTIVE

## 2022-02-24 ENCOUNTER — Other Ambulatory Visit (HOSPITAL_COMMUNITY): Payer: Self-pay

## 2022-02-24 ENCOUNTER — Ambulatory Visit (INDEPENDENT_AMBULATORY_CARE_PROVIDER_SITE_OTHER): Payer: 59 | Admitting: Internal Medicine

## 2022-02-24 ENCOUNTER — Encounter: Payer: Self-pay | Admitting: Internal Medicine

## 2022-02-24 VITALS — BP 136/84 | HR 64 | Resp 18 | Ht 65.0 in | Wt 208.0 lb

## 2022-02-24 DIAGNOSIS — Z6834 Body mass index (BMI) 34.0-34.9, adult: Secondary | ICD-10-CM | POA: Diagnosis not present

## 2022-02-24 DIAGNOSIS — L219 Seborrheic dermatitis, unspecified: Secondary | ICD-10-CM | POA: Diagnosis not present

## 2022-02-24 DIAGNOSIS — Z23 Encounter for immunization: Secondary | ICD-10-CM | POA: Diagnosis not present

## 2022-02-24 DIAGNOSIS — Z1211 Encounter for screening for malignant neoplasm of colon: Secondary | ICD-10-CM

## 2022-02-24 DIAGNOSIS — Z0001 Encounter for general adult medical examination with abnormal findings: Secondary | ICD-10-CM | POA: Diagnosis not present

## 2022-02-24 DIAGNOSIS — E782 Mixed hyperlipidemia: Secondary | ICD-10-CM | POA: Diagnosis not present

## 2022-02-24 DIAGNOSIS — E559 Vitamin D deficiency, unspecified: Secondary | ICD-10-CM | POA: Diagnosis not present

## 2022-02-24 DIAGNOSIS — E669 Obesity, unspecified: Secondary | ICD-10-CM

## 2022-02-24 MED ORDER — KETOCONAZOLE 2 % EX SHAM
1.0000 | MEDICATED_SHAMPOO | CUTANEOUS | 1 refills | Status: DC
  Filled 2022-02-24 – 2022-10-18 (×3): qty 120, 30d supply, fill #0

## 2022-02-24 NOTE — Assessment & Plan Note (Signed)
Diet modification and moderate exercise/walking at least 150 mins/week Has lost about 21 lbs with low carb diet, encouraged to continue her efforts

## 2022-02-24 NOTE — Assessment & Plan Note (Signed)
Improved with Ketoconazole and oil application now

## 2022-02-24 NOTE — Progress Notes (Signed)
Established Patient Office Visit  Subjective:  Patient ID: Penny Mcguire, female    DOB: 1955-12-14  Age: 66 y.o. MRN: [id number]  CC:  Chief Complaint  Patient presents with   Annual Exam    Annual exam     HPI Penny Mcguire is a 66 y.o. female with past medical history of eczema, chronic low back pain and obesity who presents for annual physical.  She has been doing well overall. Her rash on scalp has improved now. She requests a refill of it.  She has lost about 4 lbs since the last visit with low-carb diet.  She received Tdap vaccine in the office today.   Past Medical History:  Diagnosis Date   Fibroid uterus     Past Surgical History:  Procedure Laterality Date   CESAREAN SECTION     2   CHOLECYSTECTOMY      Family History  Problem Relation Age of Onset   Dementia Maternal Grandmother    Hyperlipidemia Mother    Hypertension Mother    Dementia Mother    Diabetes Brother    Hyperlipidemia Sister     Social History   Socioeconomic History   Marital status: Married    Spouse name: Not on file   Number of children: Not on file   Years of education: Not on file   Highest education level: Not on file  Occupational History   Not on file  Tobacco Use   Smoking status: Never   Smokeless tobacco: Never  Vaping Use   Vaping Use: Never used  Substance and Sexual Activity   Alcohol use: No   Drug use: No   Sexual activity: Yes    Birth control/protection: Post-menopausal  Other Topics Concern   Not on file  Social History Narrative   Not on file   Social Determinants of Health   Financial Resource Strain: Low Risk  (01/14/2021)   Overall Financial Resource Strain (CARDIA)    Difficulty of Paying Living Expenses: Not hard at all  Food Insecurity: No Food Insecurity (01/14/2021)   Hunger Vital Sign    Worried About Running Out of Food in the Last Year: Never true    [place] in the Last Year: Never true  Transportation Needs: No  Transportation Needs (01/14/2021)   PRAPARE - Hydrologist (Medical): No    Lack of Transportation (Non-Medical): No  Physical Activity: Insufficiently Active (01/14/2021)   Exercise Vital Sign    Days of Exercise per Week: 2 days    Minutes of Exercise per Session: 20 min  Stress: No Stress Concern Present (01/14/2021)   [place]    Feeling of Stress : Only a little  Social Connections: Moderately Integrated (01/14/2021)   Social Connection and Isolation Panel [NHANES]    Frequency of Communication with Friends and Family: Twice a week    Frequency of Social Gatherings with Friends and Family: Once a week    Attends Religious Services: More than 4 times per year    Active Member of [employer] or Organizations: No    Attends Archivist Meetings: Never    Marital Status: Married  Human resources officer Violence: Not At Risk (01/14/2021)   Humiliation, Afraid, Rape, and Kick questionnaire    Fear of Current or Ex-Partner: No    Emotionally Abused: No    Physically Abused: No    Sexually Abused: No  Outpatient Medications Prior to Visit  Medication Sig Dispense Refill   loratadine (CLARITIN) 10 MG tablet Take 10 mg by mouth daily.     Naproxen Sodium (ALEVE PO) Take by mouth.     triamcinolone (KENALOG) 0.1 % Apply 1 application topically 2 (two) times daily. 30 g 0   TURMERIC PO Take by mouth.     ketoconazole (NIZORAL) 2 % shampoo Apply 1 application topically 2 (two) times a week. (Patient taking differently: Apply 1 application  topically as needed.) 120 mL 0   Vitamin D, Ergocalciferol, (DRISDOL) 1.25 MG (50000 UNIT) CAPS capsule Take 1 capsule (50,000 Units total) by mouth every 7 (seven) days. 5 capsule 5   No facility-administered medications prior to visit.    No Known Allergies  ROS Review of Systems  Constitutional:  Negative for chills and fever.  HENT:  Negative for  congestion, sinus pressure, sinus pain and sore throat.   Eyes:  Negative for pain and discharge.  Respiratory:  Negative for cough and shortness of breath.   Cardiovascular:  Negative for chest pain and palpitations.  Gastrointestinal:  Negative for abdominal pain, constipation, diarrhea, nausea and vomiting.  Endocrine: Negative for polydipsia and polyuria.  Genitourinary:  Negative for dysuria and hematuria.  Musculoskeletal:  Negative for neck pain and neck stiffness.  Skin:  Negative for rash.  Neurological:  Negative for dizziness and weakness.  Psychiatric/Behavioral:  Negative for agitation and behavioral problems.       Objective:    Physical Exam Vitals reviewed.  Constitutional:      General: She is not in acute distress.    Appearance: She is obese. She is not diaphoretic.  HENT:     Head: Normocephalic and atraumatic.     Nose: Nose normal.     Mouth/Throat:     Mouth: Mucous membranes are moist.  Eyes:     General: No scleral icterus.    Extraocular Movements: Extraocular movements intact.  Cardiovascular:     Rate and Rhythm: Normal rate and regular rhythm.     Pulses: Normal pulses.     Heart sounds: Normal heart sounds. No murmur heard. Pulmonary:     Breath sounds: Normal breath sounds. No wheezing or rales.  Abdominal:     Palpations: Abdomen is soft.     Tenderness: There is no abdominal tenderness.  Musculoskeletal:     Cervical back: Neck supple. No tenderness.     Right lower leg: No edema.     Left lower leg: No edema.  Skin:    General: Skin is warm.     Findings: No rash.  Neurological:     General: No focal deficit present.     Mental Status: She is alert and oriented to person, place, and time.     Cranial Nerves: No cranial nerve deficit.     Sensory: No sensory deficit.     Motor: No weakness.  Psychiatric:        Mood and Affect: Mood normal.        Behavior: Behavior normal.     BP 136/84 (BP Location: Right Arm, Patient  Position: Sitting, Cuff Size: Normal)   Pulse 64   Resp 18   Ht '5\' 5"'$  (1.651 m)   Wt 208 lb (94.3 kg)   SpO2 100%   BMI 34.61 kg/m  Wt Readings from Last 3 Encounters:  02/24/22 208 lb (94.3 kg)  08/26/21 211 lb (95.7 kg)  03/25/21 228 lb (103.4 kg)  Lab Results  Component Value Date   TSH 1.852 02/03/2022   Lab Results  Component Value Date   WBC 5.0 02/03/2022   HGB 13.6 02/03/2022   HCT 41.6 02/03/2022   MCV 92.4 02/03/2022   PLT 253 02/03/2022   Lab Results  Component Value Date   NA 138 02/03/2022   K 4.1 02/03/2022   CO2 28 02/03/2022   GLUCOSE 101 (H) 02/03/2022   BUN 13 02/03/2022   CREATININE 0.59 02/03/2022   BILITOT 0.6 02/03/2022   ALKPHOS 85 02/03/2022   AST 18 02/03/2022   ALT 19 02/03/2022   PROT 7.4 02/03/2022   ALBUMIN 3.8 02/03/2022   CALCIUM 9.0 02/03/2022   ANIONGAP 6 02/03/2022   Lab Results  Component Value Date   CHOL 202 (H) 02/03/2022   Lab Results  Component Value Date   HDL 59 02/03/2022   Lab Results  Component Value Date   LDLCALC 124 (H) 02/03/2022   Lab Results  Component Value Date   TRIG 97 02/03/2022   Lab Results  Component Value Date   CHOLHDL 3.4 02/03/2022   Lab Results  Component Value Date   HGBA1C 5.5 02/03/2022      Assessment & Plan:   Problem List Items Addressed This Visit       Musculoskeletal and Integument   Seborrheic dermatitis of scalp    Improved with Ketoconazole and oil application now      Relevant Medications   ketoconazole (NIZORAL) 2 % shampoo (Start on 02/27/2022)     Other   Obesity, unspecified    Diet modification and moderate exercise/walking at least 150 mins/week Has lost about 21 lbs with low carb diet, encouraged to continue her efforts      Vitamin D deficiency    Last vitamin D Lab Results  Component Value Date   VD25OH 40.00 02/03/2022  Completed Vitamin D 50,000 qw Switch to Vitamin D 2000 IU QD      Encounter for general adult medical examination  with abnormal findings - Primary    Physical exam as documented. Fasting blood tests reviewed and discussed with the patient in detail.      Mixed hyperlipidemia    Lipid profile reviewed Advised to follow low carb diet for now      Other Visit Diagnoses     Special screening for malignant neoplasms, colon       Relevant Orders   Cologuard   Need for Tdap vaccination       Relevant Orders   Tdap vaccine greater than or equal to 7yo IM       Meds ordered this encounter  Medications   ketoconazole (NIZORAL) 2 % shampoo    Sig: Apply 1 Application topically 2 (two) times a week.    Dispense:  120 mL    Refill:  1    Follow-up: Return in about 1 year (around 02/25/2023) for Annual physical.    Lindell Spar, MD

## 2022-02-24 NOTE — Assessment & Plan Note (Signed)
Physical exam as documented. Fasting blood tests reviewed and discussed with the patient in detail. 

## 2022-02-24 NOTE — Assessment & Plan Note (Signed)
Last vitamin D Lab Results  Component Value Date   VD25OH 40.00 02/03/2022   Completed Vitamin D 50,000 qw Switch to Vitamin D 2000 IU QD

## 2022-02-24 NOTE — Assessment & Plan Note (Signed)
Lipid profile reviewed Advised to follow low-carb diet for now 

## 2022-02-24 NOTE — Patient Instructions (Addendum)
Please take Vitamin D 2000 IU once daily.  Please continue to follow low carb diet and perform moderate exercise/walking at least 150 mins/week.

## 2022-04-10 DIAGNOSIS — Z1211 Encounter for screening for malignant neoplasm of colon: Secondary | ICD-10-CM | POA: Diagnosis not present

## 2022-04-18 LAB — COLOGUARD: COLOGUARD: NEGATIVE

## 2022-06-12 IMAGING — MG MM DIGITAL SCREENING BILAT W/ TOMO AND CAD
6 of 12 series · 6 of 36 positions shown · non-contrast
Comparison: Previous exam(s).

CLINICAL DATA: Screening.

EXAM:
DIGITAL SCREENING BILATERAL MAMMOGRAM WITH TOMOSYNTHESIS AND CAD
TECHNIQUE: Bilateral screening digital craniocaudal and mediolateral oblique
mammograms were obtained. Bilateral screening digital breast
tomosynthesis was performed. The images were evaluated with
computer-aided detection.

[R CC synth-2D (1 of 2)]
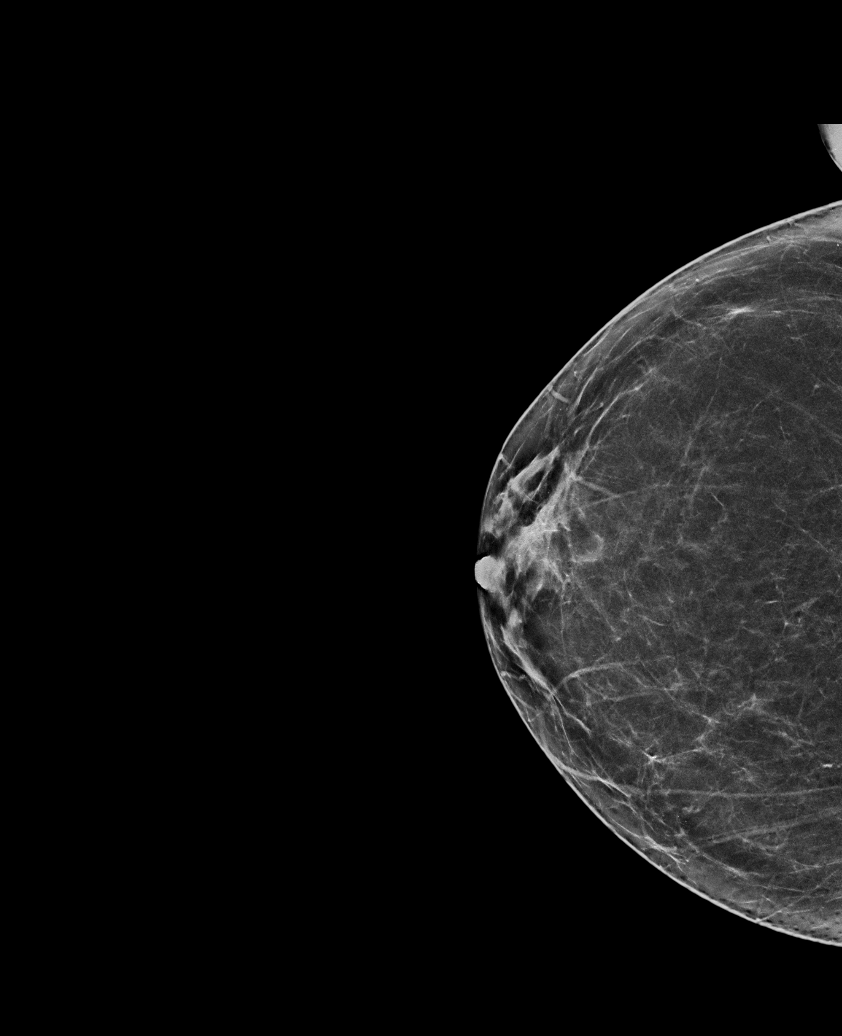

[L CC synth-2D (1 of 2)]
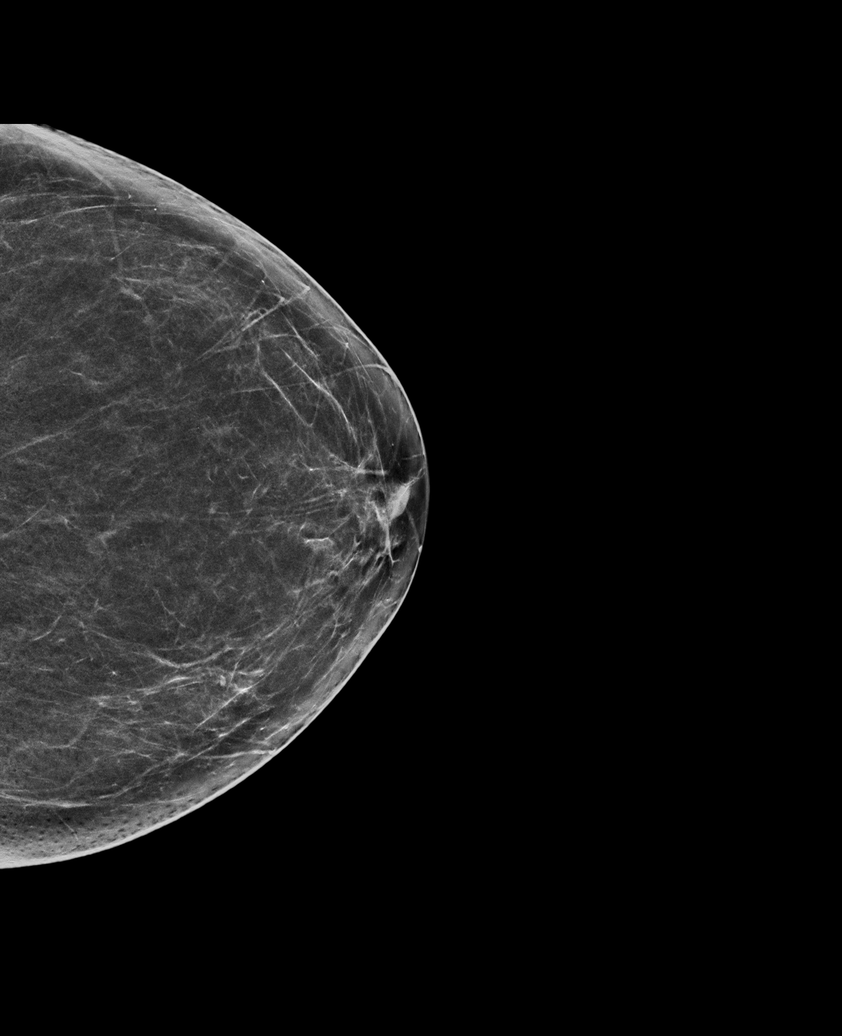

[L MLO synth-2D]
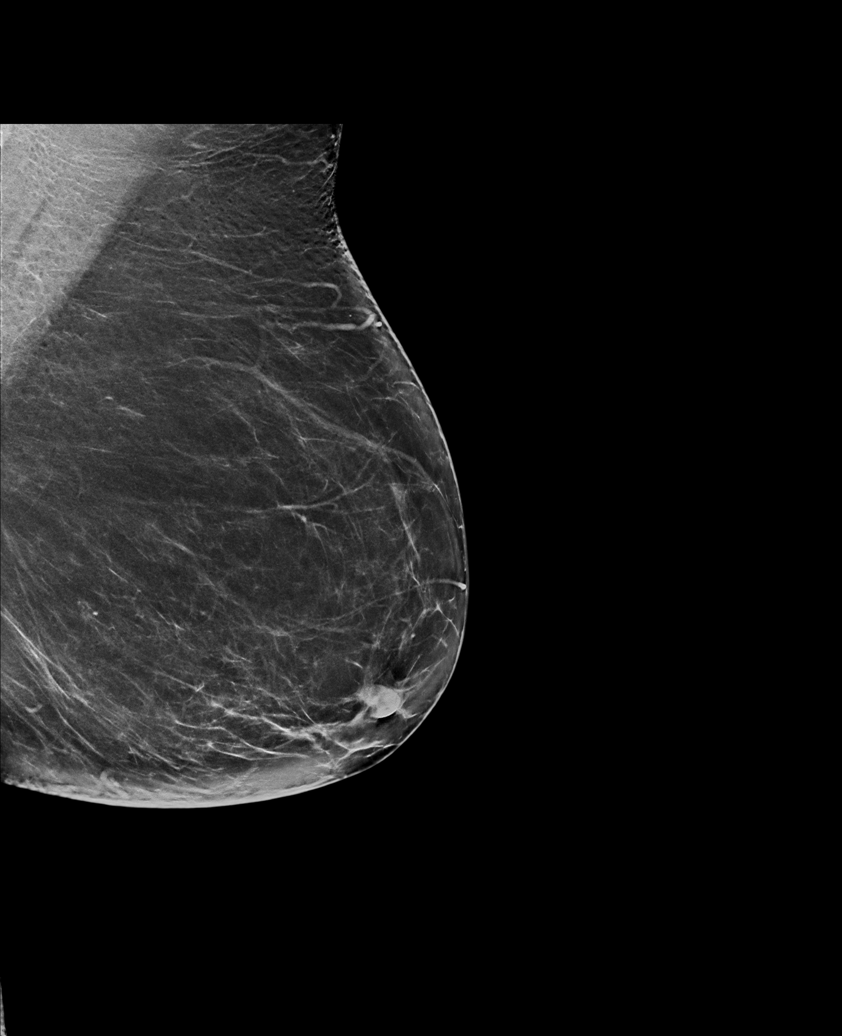

[L CC synth-2D (2 of 2)]
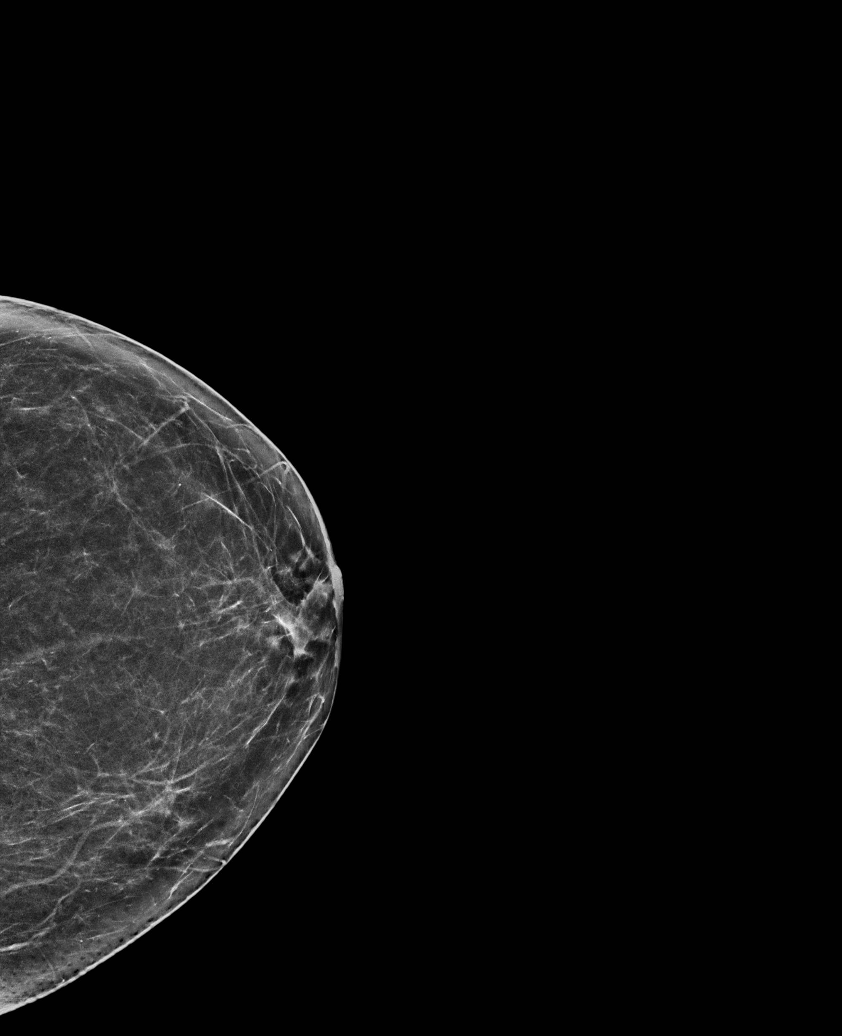

[R MLO synth-2D]
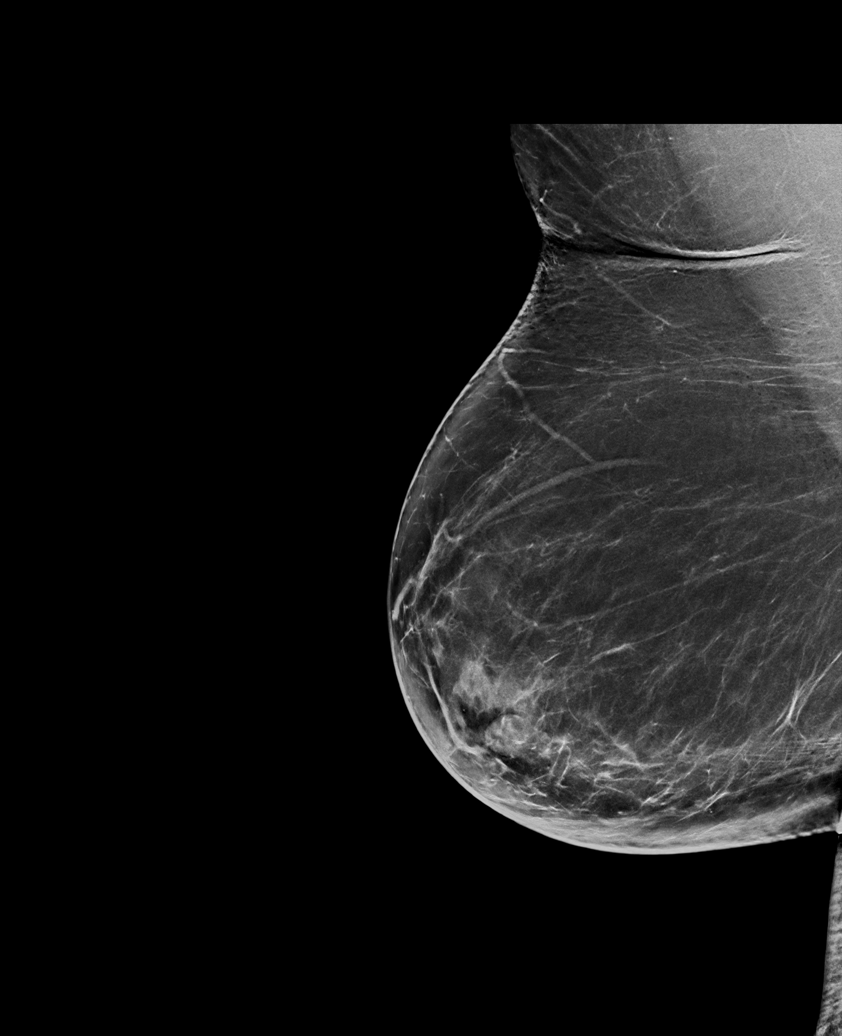

[R CC synth-2D (2 of 2)]
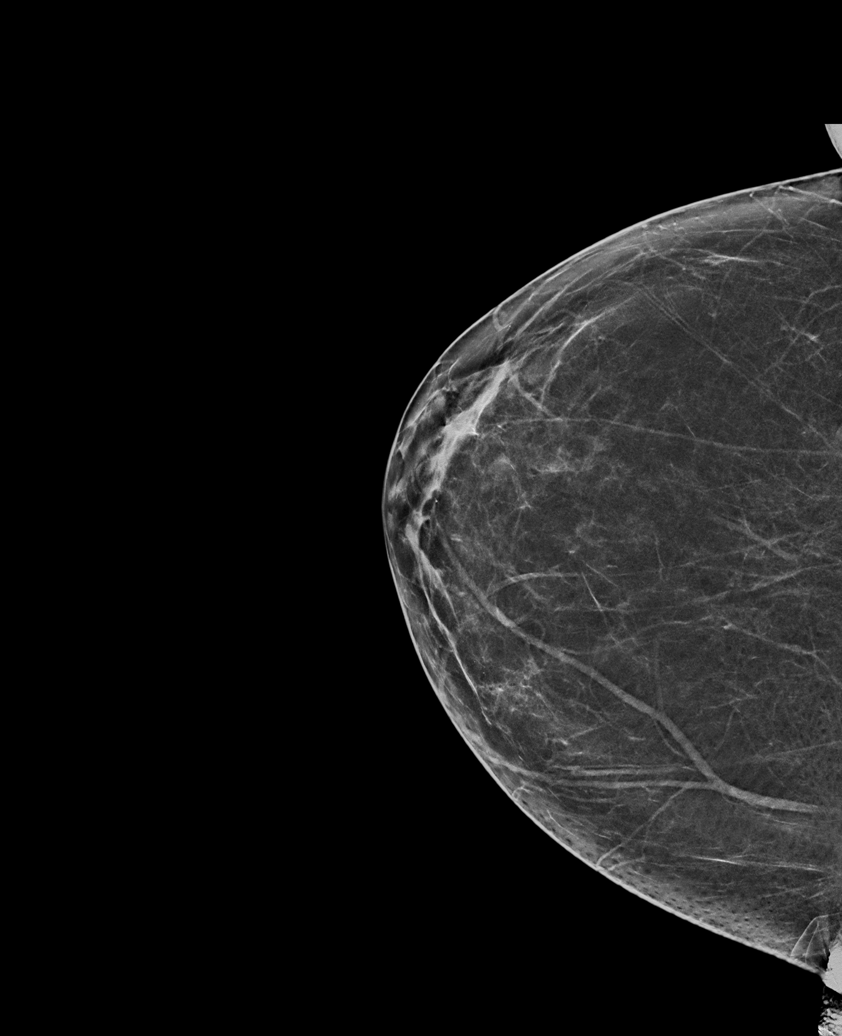

[6 of 36 positions shown; findings below may reference images not displayed]

ACR Breast Density Category b: There are scattered areas of
fibroglandular density.
FINDINGS: There are no findings suspicious for malignancy.
IMPRESSION: No mammographic evidence of malignancy. A result letter of this
screening mammogram will be mailed directly to the patient.

RECOMMENDATION:
Screening mammogram in one year. (Code:51-O-LD2)

BI-RADS CATEGORY  1: Negative.

## 2022-07-17 ENCOUNTER — Ambulatory Visit (INDEPENDENT_AMBULATORY_CARE_PROVIDER_SITE_OTHER): Payer: 59 | Admitting: Internal Medicine

## 2022-07-17 ENCOUNTER — Encounter: Payer: Self-pay | Admitting: Internal Medicine

## 2022-07-17 DIAGNOSIS — J209 Acute bronchitis, unspecified: Secondary | ICD-10-CM | POA: Diagnosis not present

## 2022-07-17 DIAGNOSIS — J019 Acute sinusitis, unspecified: Secondary | ICD-10-CM | POA: Diagnosis not present

## 2022-07-17 MED ORDER — AZITHROMYCIN 250 MG PO TABS: ORAL_TABLET | ORAL | 0 refills | Status: AC

## 2022-07-17 MED ORDER — METHYLPREDNISOLONE 4 MG PO TBPK: ORAL_TABLET | ORAL | 0 refills | Status: DC

## 2022-07-17 MED ORDER — ALBUTEROL SULFATE HFA 108 (90 BASE) MCG/ACT IN AERS: 2.0000 | INHALATION_SPRAY | Freq: Four times a day (QID) | RESPIRATORY_TRACT | 0 refills | Status: AC | PRN

## 2022-07-17 NOTE — Progress Notes (Signed)
Virtual Visit via Telephone Note   This visit type was conducted via telephone. This format is felt to be most appropriate for this patient at this time.  The patient did not have access to video technology/had technical difficulties with video requiring transitioning to audio format only (telephone).  All issues noted in this document were discussed and addressed.  No physical exam could be performed with this format.  Evaluation Performed:  Follow-up visit  Date:  07/17/2022   ID:  Penny Mcguire, DOB 1955-10-05, MRN [id number]  Patient Location: Home Provider Location: Office/Clinic  Participants: Patient Location of Patient: Home Location of Provider: Telehealth Consent was obtain for visit to be over via telehealth. I verified that I am speaking with the correct person using two identifiers.  PCP:  Lindell Spar, MD   Chief Complaint: Cough and wheezing  History of Present Illness:    Penny Mcguire is a 66 y.o. female who has a televisit for c/o cough, dyspnea and wheezing for the last 1 week. She has chest tightness. Denies any fever, chills, chest pain or palpitations. She also has mild nasal congestion and sore throat. Denies any hemoptysis, nausea or vomiting.  The patient does not have symptoms concerning for COVID-19 infection (fever, chills, cough, or new shortness of breath).   Past Medical, Surgical, Social History, Allergies, and Medications have been Reviewed.  Past Medical History:  Diagnosis Date   Fibroid uterus    Past Surgical History:  Procedure Laterality Date   CESAREAN SECTION     2   CHOLECYSTECTOMY       No outpatient medications have been marked as taking for the 07/17/22 encounter (Office Visit) with Lindell Spar, MD.     Allergies:   Patient has no known allergies.   ROS:   Please see the history of present illness.     All other systems reviewed and are negative.   Labs/Other Tests and Data Reviewed:    Recent  Labs: 02/03/2022: ALT 19; BUN 13; Creatinine, Ser 0.59; Hemoglobin 13.6; Platelets 253; Potassium 4.1; Sodium 138; TSH 1.852   Recent Lipid Panel Lab Results  Component Value Date/Time   CHOL 202 (H) 02/03/2022 08:29 AM   TRIG 97 02/03/2022 08:29 AM   HDL 59 02/03/2022 08:29 AM   CHOLHDL 3.4 02/03/2022 08:29 AM   LDLCALC 124 (H) 02/03/2022 08:29 AM    Wt Readings from Last 3 Encounters:  02/24/22 208 lb (94.3 kg)  08/26/21 211 lb (95.7 kg)  03/25/21 228 lb (103.4 kg)     ASSESSMENT & PLAN:    Acute bronchitis Started Medrol Dosepak Albuterol as needed for dyspnea or wheezing Started azithromycin for empiric coverage for acute sinusitis Nasal saline spray as needed for nasal congestion Claritin for allergies  Time:   Today, I have spent 11 minutes reviewing the chart, including problem list, medications, and with the patient with telehealth technology discussing the above problems.   Medication Adjustments/Labs and Tests Ordered: Current medicines are reviewed at length with the patient today.  Concerns regarding medicines are outlined above.   Tests Ordered: No orders of the defined types were placed in this encounter.   Medication Changes: No orders of the defined types were placed in this encounter.    Note: This dictation was prepared with Dragon dictation along with smaller phrase technology. Similar sounding words can be transcribed inadequately or may not be corrected upon review. Any transcriptional errors that result from this process are  unintentional.      Disposition:  Follow up  Signed, Lindell Spar, MD  07/17/2022 11:43 AM     [place]

## 2022-08-22 ENCOUNTER — Other Ambulatory Visit (HOSPITAL_COMMUNITY): Payer: Self-pay | Admitting: Adult Health

## 2022-08-22 DIAGNOSIS — Z1231 Encounter for screening mammogram for malignant neoplasm of breast: Secondary | ICD-10-CM

## 2022-08-28 ENCOUNTER — Ambulatory Visit (HOSPITAL_COMMUNITY)
Admission: RE | Admit: 2022-08-28 | Discharge: 2022-08-28 | Disposition: A | Discharge status: Home / Self Care | Payer: 59 | Source: Ambulatory Visit | Attending: Adult Health | Admitting: Adult Health

## 2022-08-28 DIAGNOSIS — Z1231 Encounter for screening mammogram for malignant neoplasm of breast: Secondary | ICD-10-CM | POA: Diagnosis not present

## 2022-09-26 ENCOUNTER — Other Ambulatory Visit: Payer: Self-pay | Admitting: Internal Medicine

## 2022-09-26 ENCOUNTER — Other Ambulatory Visit (HOSPITAL_COMMUNITY): Payer: Self-pay

## 2022-10-03 ENCOUNTER — Other Ambulatory Visit (HOSPITAL_COMMUNITY): Payer: Self-pay

## 2022-10-05 ENCOUNTER — Telehealth: Payer: Self-pay | Admitting: Internal Medicine

## 2022-10-05 ENCOUNTER — Other Ambulatory Visit (HOSPITAL_COMMUNITY): Payer: Self-pay

## 2022-10-05 ENCOUNTER — Other Ambulatory Visit: Payer: Self-pay

## 2022-10-05 ENCOUNTER — Other Ambulatory Visit: Payer: Self-pay | Admitting: Internal Medicine

## 2022-10-05 DIAGNOSIS — H5203 Hypermetropia, bilateral: Secondary | ICD-10-CM | POA: Diagnosis not present

## 2022-10-05 DIAGNOSIS — L2082 Flexural eczema: Secondary | ICD-10-CM

## 2022-10-05 MED ORDER — TRIAMCINOLONE ACETONIDE 0.1 % EX CREA
1.0000 | TOPICAL_CREAM | Freq: Two times a day (BID) | CUTANEOUS | 1 refills | Status: AC
  Filled 2022-10-05 (×2): qty 30, 15d supply, fill #0
  Filled 2022-10-18: qty 30, 15d supply, fill #1

## 2022-10-05 NOTE — Telephone Encounter (Signed)
Patient called in having an eczema flare, wants to see if provider can send in   triamcinolone (KENALOG) 0.1 %  [place], [place] - [address] [facility]. [address] [place] [address] Phone: [phone]  Fax: [phone]

## 2022-10-06 ENCOUNTER — Other Ambulatory Visit (HOSPITAL_COMMUNITY): Payer: Self-pay

## 2022-10-06 ENCOUNTER — Other Ambulatory Visit: Payer: Self-pay

## 2022-10-09 ENCOUNTER — Other Ambulatory Visit (HOSPITAL_COMMUNITY): Payer: Self-pay

## 2022-10-18 ENCOUNTER — Other Ambulatory Visit (HOSPITAL_COMMUNITY): Payer: Self-pay

## 2022-11-22 ENCOUNTER — Ambulatory Visit (INDEPENDENT_AMBULATORY_CARE_PROVIDER_SITE_OTHER): Payer: 59 | Admitting: Internal Medicine

## 2022-11-22 ENCOUNTER — Encounter: Payer: Self-pay | Admitting: Internal Medicine

## 2022-11-22 VITALS — BP 131/65 | HR 71 | Ht 65.0 in | Wt 215.0 lb

## 2022-11-22 DIAGNOSIS — H66003 Acute suppurative otitis media without spontaneous rupture of ear drum, bilateral: Secondary | ICD-10-CM | POA: Diagnosis not present

## 2022-11-22 MED ORDER — CIPROFLOXACIN-DEXAMETHASONE 0.3-0.1 % OT SUSP: 4.0000 [drp] | Freq: Two times a day (BID) | OTIC | 0 refills | Status: DC

## 2022-11-22 MED ORDER — AZITHROMYCIN 250 MG PO TABS: ORAL_TABLET | ORAL | 0 refills | Status: AC

## 2022-11-22 NOTE — Progress Notes (Signed)
Acute Office Visit  Subjective:    Patient ID: NICKCOLE BRALLEY, female    DOB: [birth year], 67 y.o.   MRN: [id number]  Chief Complaint  Patient presents with   Ear Pain    Patient states she has eczema that has spread in her ears, her ears are now burning , muffled, in pain. Concerned for an infection or fluid build up    HPI Patient is in today for bilateral ear redness, muffled sounds and clear discharge for the last 2 weeks.  She denies any fever or chills.  She has mild tenderness in the external ear.  She has history of eczema and was concerned that her eczema has spread to her ears.  Past Medical History:  Diagnosis Date   Fibroid uterus     Past Surgical History:  Procedure Laterality Date   CESAREAN SECTION     2   CHOLECYSTECTOMY      Family History  Problem Relation Age of Onset   Dementia Maternal Grandmother    Hyperlipidemia Mother    Hypertension Mother    Dementia Mother    Diabetes Brother    Hyperlipidemia Sister     Social History   Socioeconomic History   Marital status: Married    Spouse name: Not on file   Number of children: Not on file   Years of education: Not on file   Highest education level: Not on file  Occupational History   Not on file  Tobacco Use   Smoking status: Never   Smokeless tobacco: Never  Vaping Use   Vaping Use: Never used  Substance and Sexual Activity   Alcohol use: No   Drug use: No   Sexual activity: Yes    Birth control/protection: Post-menopausal  Other Topics Concern   Not on file  Social History Narrative   Not on file   Social Determinants of Health   Financial Resource Strain: Low Risk  (01/14/2021)   Overall Financial Resource Strain (CARDIA)    Difficulty of Paying Living Expenses: Not hard at all  Food Insecurity: No Food Insecurity (01/14/2021)   Hunger Vital Sign    Worried About Running Out of Food in the Last Year: Never true    Ran Out of Food in the Last Year: Never true  Transportation  Needs: No Transportation Needs (01/14/2021)   PRAPARE - Administrator, Civil Service (Medical): No    Lack of Transportation (Non-Medical): No  Physical Activity: Insufficiently Active (01/14/2021)   Exercise Vital Sign    Days of Exercise per Week: 2 days    Minutes of Exercise per Session: 20 min  Stress: No Stress Concern Present (01/14/2021)   [employer] of Occupational Health - Occupational Stress Questionnaire    Feeling of Stress : Only a little  Social Connections: Moderately Integrated (01/14/2021)   Social Connection and Isolation Panel [NHANES]    Frequency of Communication with Friends and Family: Twice a week    Frequency of Social Gatherings with Friends and Family: Once a week    Attends Religious Services: More than 4 times per year    Active Member of [employer] or Organizations: No    Attends Banker Meetings: Never    Marital Status: Married  Catering manager Violence: Not At Risk (01/14/2021)   Humiliation, Afraid, Rape, and Kick questionnaire    Fear of Current or Ex-Partner: No    Emotionally Abused: No    Physically Abused: No  Sexually Abused: No    Outpatient Medications Prior to Visit  Medication Sig Dispense Refill   albuterol (VENTOLIN HFA) 108 (90 Base) MCG/ACT inhaler Inhale 2 puffs into the lungs every 6 (six) hours as needed for wheezing or shortness of breath. 18 g 0   ketoconazole (NIZORAL) 2 % shampoo Apply 1 Application topically 2 (two) times a week. 120 mL 1   loratadine (CLARITIN) 10 MG tablet Take 10 mg by mouth daily.     methylPREDNISolone (MEDROL DOSEPAK) 4 MG TBPK tablet Take as package instructions. 1 each 0   Naproxen Sodium (ALEVE PO) Take by mouth.     TURMERIC PO Take by mouth.     No facility-administered medications prior to visit.    No Known Allergies  Review of Systems  Constitutional:  Negative for chills and fever.  HENT:  Positive for ear discharge and ear pain. Negative for congestion, sinus  pressure, sinus pain and sore throat.   Eyes:  Negative for pain and discharge.  Respiratory:  Negative for cough and shortness of breath.   Cardiovascular:  Negative for chest pain and palpitations.  Gastrointestinal:  Negative for abdominal pain, constipation, diarrhea, nausea and vomiting.  Endocrine: Negative for polydipsia and polyuria.  Genitourinary:  Negative for dysuria and hematuria.  Musculoskeletal:  Negative for neck pain and neck stiffness.  Skin:  Negative for rash.  Neurological:  Negative for dizziness and weakness.  Psychiatric/Behavioral:  Negative for agitation and behavioral problems.        Objective:    Physical Exam Vitals reviewed.  Constitutional:      General: She is not in acute distress.    Appearance: She is obese. She is not diaphoretic.  HENT:     Head: Normocephalic and atraumatic.     Right Ear: Tenderness present. A middle ear effusion is present.     Left Ear: Tenderness present. A middle ear effusion is present.     Nose: Nose normal.     Mouth/Throat:     Mouth: Mucous membranes are moist.  Eyes:     General: No scleral icterus.    Extraocular Movements: Extraocular movements intact.  Cardiovascular:     Rate and Rhythm: Normal rate and regular rhythm.     Pulses: Normal pulses.     Heart sounds: Normal heart sounds. No murmur heard. Pulmonary:     Breath sounds: Normal breath sounds. No wheezing or rales.  Musculoskeletal:     Cervical back: Neck supple. No tenderness.     Right lower leg: No edema.     Left lower leg: No edema.  Skin:    General: Skin is warm.     Findings: No rash.  Neurological:     General: No focal deficit present.     Mental Status: She is alert and oriented to person, place, and time.     Cranial Nerves: No cranial nerve deficit.     Sensory: No sensory deficit.     Motor: No weakness.  Psychiatric:        Mood and Affect: Mood normal.        Behavior: Behavior normal.     BP 131/65 (BP Location:  Right Arm, Patient Position: Sitting, Cuff Size: Large)   Pulse 71   Ht 5\' 5"  (1.651 m)   Wt 215 lb (97.5 kg)   SpO2 96%   BMI 35.78 kg/m  Wt Readings from Last 3 Encounters:  11/22/22 215 lb (97.5 kg)  02/24/22 208 lb (  94.3 kg)  08/26/21 211 lb (95.7 kg)        Assessment & Plan:   Problem List Items Addressed This Visit       Nervous and Auditory   Non-recurrent acute suppurative otitis media of both ears without spontaneous rupture of tympanic membranes - Primary    She has middle ear effusion and tenderness over tragus with erythema Likely otitis media Started oral azithromycin Ciprodex otic drops for local action, can also help with infected eczema of ear If persistent, refer to ENT specialist      Relevant Medications   azithromycin (ZITHROMAX) 250 MG tablet   ciprofloxacin-dexamethasone (CIPRODEX) OTIC suspension     Meds ordered this encounter  Medications   azithromycin (ZITHROMAX) 250 MG tablet    Sig: Take 2 tablets on day 1, then 1 tablet daily on days 2 through 5    Dispense:  6 tablet    Refill:  0   ciprofloxacin-dexamethasone (CIPRODEX) OTIC suspension    Sig: Place 4 drops into both ears 2 (two) times daily.    Dispense:  7.5 mL    Refill:  0     Clennon Nasca Concha Se, MD

## 2022-11-22 NOTE — Patient Instructions (Addendum)
Please start taking Azithromycin as prescribed.  Please use Ciprodex ear drops in both ears for next 1 week.  Okay to use washcloth for cleaning purposes.

## 2022-11-22 NOTE — Assessment & Plan Note (Addendum)
She has middle ear effusion and tenderness over tragus with erythema Likely otitis media Started oral azithromycin Ciprodex otic drops for local action, can also help with infected eczema of ear If persistent, refer to ENT specialist

## 2023-01-25 ENCOUNTER — Other Ambulatory Visit: Payer: Self-pay | Admitting: Internal Medicine

## 2023-01-25 DIAGNOSIS — L219 Seborrheic dermatitis, unspecified: Secondary | ICD-10-CM

## 2023-01-26 ENCOUNTER — Other Ambulatory Visit (HOSPITAL_COMMUNITY): Payer: Self-pay

## 2023-01-26 ENCOUNTER — Other Ambulatory Visit: Payer: Self-pay

## 2023-01-26 MED ORDER — KETOCONAZOLE 2 % EX SHAM
1.0000 | MEDICATED_SHAMPOO | CUTANEOUS | 1 refills | Status: AC
Start: 2023-01-29 — End: ?
  Filled 2023-01-26: qty 120, 30d supply, fill #0

## 2023-03-02 ENCOUNTER — Encounter: Payer: 59 | Admitting: Internal Medicine

## 2023-04-06 ENCOUNTER — Encounter: Payer: 59 | Admitting: Internal Medicine

## 2023-05-11 ENCOUNTER — Encounter: Payer: 59 | Admitting: Internal Medicine

## 2023-06-01 ENCOUNTER — Ambulatory Visit: Payer: Medicare Other | Admitting: Internal Medicine

## 2023-06-01 ENCOUNTER — Encounter: Payer: Self-pay | Admitting: Internal Medicine

## 2023-06-01 VITALS — BP 138/88 | HR 74 | Resp 16 | Ht 65.0 in | Wt 220.6 lb

## 2023-06-01 DIAGNOSIS — R030 Elevated blood-pressure reading, without diagnosis of hypertension: Secondary | ICD-10-CM | POA: Diagnosis not present

## 2023-06-01 DIAGNOSIS — E782 Mixed hyperlipidemia: Secondary | ICD-10-CM | POA: Diagnosis not present

## 2023-06-01 DIAGNOSIS — Z780 Asymptomatic menopausal state: Secondary | ICD-10-CM | POA: Diagnosis not present

## 2023-06-01 DIAGNOSIS — Z23 Encounter for immunization: Secondary | ICD-10-CM

## 2023-06-01 DIAGNOSIS — E559 Vitamin D deficiency, unspecified: Secondary | ICD-10-CM

## 2023-06-01 DIAGNOSIS — Z0000 Encounter for general adult medical examination without abnormal findings: Secondary | ICD-10-CM

## 2023-06-01 DIAGNOSIS — R739 Hyperglycemia, unspecified: Secondary | ICD-10-CM

## 2023-06-01 NOTE — Progress Notes (Signed)
Subjective:    Penny Mcguire is a 67 y.o. female who presents for a Welcome to Medicare exam.   Cardiac Risk Factors include: dyslipidemia;obesity (BMI >30kg/m2);sedentary lifestyle      Objective:     Today's Vitals   06/01/23 0849 06/01/23 0934  BP: (!) 141/85 138/88  Pulse: 74   Resp: 16   SpO2: 92%   Weight: 220 lb 9.6 oz (100.1 kg)   Height: 5\' 5"  (1.651 m)   Body mass index is 36.71 kg/m.     Medications Outpatient Encounter Medications as of 06/01/2023  Medication Sig   albuterol (VENTOLIN HFA) 108 (90 Base) MCG/ACT inhaler Inhale 2 puffs into the lungs every 6 (six) hours as needed for wheezing or shortness of breath.   ciprofloxacin-dexamethasone (CIPRODEX) OTIC suspension Place 4 drops into both ears 2 (two) times daily.   ketoconazole (NIZORAL) 2 % shampoo Apply 1 Application topically 2 (two) times a week.   loratadine (CLARITIN) 10 MG tablet Take 10 mg by mouth daily.   Naproxen Sodium (ALEVE PO) Take by mouth.   TURMERIC PO Take by mouth.   [DISCONTINUED] methylPREDNISolone (MEDROL DOSEPAK) 4 MG TBPK tablet Take as package instructions.   No facility-administered encounter medications on file as of 06/01/2023.     History: Past Medical History:  Diagnosis Date   Fibroid uterus    Past Surgical History:  Procedure Laterality Date   CESAREAN SECTION     2   CHOLECYSTECTOMY      Family History  Problem Relation Age of Onset   Dementia Maternal Grandmother    Hyperlipidemia Mother    Hypertension Mother    Dementia Mother    Diabetes Brother    Hyperlipidemia Sister    Social History   Occupational History   Not on file  Tobacco Use   Smoking status: Never   Smokeless tobacco: Never  Vaping Use   Vaping status: Never Used  Substance and Sexual Activity   Alcohol use: No   Drug use: No   Sexual activity: Yes    Birth control/protection: Post-menopausal    Tobacco Counseling Counseling given: Not Answered   Immunizations and  Health Maintenance Immunization History  Administered Date(s) Administered   Influenza,inj,Quad PF,6+ Mos 04/22/2020, 04/21/2021, 04/29/2022   Influenza-Unspecified 04/26/2023   Moderna Sars-Covid-2 Vaccination 08/25/2019, 09/23/2019, 06/03/2020, 11/25/2020   PNEUMOCOCCAL CONJUGATE-20 06/01/2023   Tdap 02/24/2022   Zoster Recombinant(Shingrix) 03/25/2021, 08/26/2021   Health Maintenance Due  Topic Date Due   DEXA SCAN  Never done   COVID-19 Vaccine (5 - 2023-24 season) 04/01/2023    Activities of Daily Living    06/01/2023    8:56 AM  In your present state of health, do you have any difficulty performing the following activities:  Hearing? 0  Vision? 0  Difficulty concentrating or making decisions? 0  Walking or climbing stairs? 0  Dressing or bathing? 0  Doing errands, shopping? 0  Preparing Food and eating ? N  Using the Toilet? N  In the past six months, have you accidently leaked urine? Y  Do you have problems with loss of bowel control? N  Managing your Medications? N  Managing your Finances? N  Housekeeping or managing your Housekeeping? N    Physical Exam   Physical Exam Vitals reviewed.  Constitutional:      General: She is not in acute distress.    Appearance: She is obese. She is not diaphoretic.  HENT:     Head: Normocephalic and  atraumatic.     Nose: Nose normal.     Mouth/Throat:     Mouth: Mucous membranes are moist.  Eyes:     General: No scleral icterus.    Extraocular Movements: Extraocular movements intact.  Cardiovascular:     Rate and Rhythm: Normal rate and regular rhythm.     Pulses: Normal pulses.     Heart sounds: Normal heart sounds. No murmur heard. Pulmonary:     Breath sounds: Normal breath sounds. No wheezing or rales.  Musculoskeletal:     Cervical back: Neck supple. No tenderness.     Right lower leg: No edema.     Left lower leg: No edema.  Skin:    General: Skin is warm.     Findings: No rash.  Neurological:     General:  No focal deficit present.     Mental Status: She is alert and oriented to person, place, and time.     Cranial Nerves: No cranial nerve deficit.     Sensory: No sensory deficit.     Motor: No weakness.  Psychiatric:        Mood and Affect: Mood normal.        Behavior: Behavior normal.       Advanced Directives: Yes, she has Advanced directive.    EKG:  normal EKG, normal sinus rhythm, there are no previous tracings available for comparison      Assessment:    This is a routine wellness examination for this patient .   Vision/Hearing screen No results found.   Goals   None    Depression Screen    06/01/2023    8:55 AM 11/22/2022    8:53 AM 02/24/2022    9:51 AM 08/26/2021   10:02 AM  PHQ 2/9 Scores  PHQ - 2 Score 0 0 0 0     Fall Risk    06/01/2023    8:56 AM  Fall Risk   Falls in the past year? 0  Number falls in past yr: 0  Injury with Fall? 0    Cognitive Function:        06/01/2023    8:57 AM  6CIT Screen  What Year? 0 points  What month? 0 points  What time? 0 points  Count back from 20 0 points  Months in reverse 0 points  Repeat phrase 0 points  Total Score 0 points    Patient Care Team: Anabel Halon, MD as PCP - General (Internal Medicine)     Plan:    Encounter for Medicare annual wellness exam Screening questionnaire reviewed. Physical exam as documented. Immunization and cancer screening needs are specifically addressed at this visit. Has an advance directive. Up to date with influenza and Shingrix vaccines. Pneumococcal 20 vaccine today. DEXA scan ordered today.   Prehypertension BP Readings from Last 1 Encounters:  06/01/23 138/88   Advised to check BP at home and contact if persistently elevated - > 140/90 Recheck in office in 3 months Advised DASH diet and moderate exercise/walking, at least 150 mins/week   Mixed hyperlipidemia Check lipid profile Advised to follow low carb diet for now  Vitamin D  deficiency Last vitamin D Lab Results  Component Value Date   VD25OH 40.00 02/03/2022   Continue Vitamin D 2000 IU QD   I have personally reviewed and noted the following in the patient's chart:   Medical and social history Use of alcohol, tobacco or illicit drugs  Current medications and supplements  Functional ability and status Nutritional status Physical activity Advanced directives List of other physicians Hospitalizations, surgeries, and ER visits in previous 12 months Vitals Screenings to include cognitive, depression, and falls Referrals and appointments  In addition, I have reviewed and discussed with patient certain preventive protocols, quality metrics, and best practice recommendations. A written personalized care plan for preventive services as well as general preventive health recommendations were provided to patient.     Anabel Halon, MD 06/01/2023

## 2023-06-01 NOTE — Assessment & Plan Note (Signed)
Check lipid profile Advised to follow low carb diet for now 

## 2023-06-01 NOTE — Assessment & Plan Note (Signed)
BP Readings from Last 1 Encounters:  06/01/23 138/88   Advised to check BP at home and contact if persistently elevated - > 140/90 Recheck in office in 3 months Advised DASH diet and moderate exercise/walking, at least 150 mins/week

## 2023-06-01 NOTE — Assessment & Plan Note (Signed)
Last vitamin D Lab Results  Component Value Date   VD25OH 40.00 02/03/2022   Continue Vitamin D 2000 IU QD

## 2023-06-01 NOTE — Assessment & Plan Note (Signed)
Screening questionnaire reviewed. Physical exam as documented. Immunization and cancer screening needs are specifically addressed at this visit. Has an advance directive. Up to date with influenza and Shingrix vaccines. Pneumococcal 20 vaccine today. DEXA scan ordered today.

## 2023-08-09 DIAGNOSIS — K080 Exfoliation of teeth due to systemic causes: Secondary | ICD-10-CM | POA: Diagnosis not present

## 2023-08-14 ENCOUNTER — Other Ambulatory Visit (HOSPITAL_COMMUNITY): Payer: Self-pay | Admitting: Internal Medicine

## 2023-08-14 DIAGNOSIS — Z1231 Encounter for screening mammogram for malignant neoplasm of breast: Secondary | ICD-10-CM

## 2023-08-22 ENCOUNTER — Ambulatory Visit (HOSPITAL_COMMUNITY)
Admission: RE | Admit: 2023-08-22 | Discharge: 2023-08-22 | Disposition: A | Discharge status: Home / Self Care | Payer: Medicare Other | Source: Ambulatory Visit | Attending: Internal Medicine | Admitting: Internal Medicine

## 2023-08-22 ENCOUNTER — Encounter: Payer: Self-pay | Admitting: Internal Medicine

## 2023-08-22 DIAGNOSIS — Z780 Asymptomatic menopausal state: Secondary | ICD-10-CM | POA: Diagnosis not present

## 2023-08-31 ENCOUNTER — Other Ambulatory Visit (HOSPITAL_COMMUNITY)
Admission: RE | Admit: 2023-08-31 | Discharge: 2023-08-31 | Disposition: A | Discharge status: Home / Self Care | Payer: Medicare Other | Source: Ambulatory Visit | Attending: Internal Medicine | Admitting: Internal Medicine

## 2023-08-31 DIAGNOSIS — E782 Mixed hyperlipidemia: Secondary | ICD-10-CM | POA: Insufficient documentation

## 2023-08-31 DIAGNOSIS — E559 Vitamin D deficiency, unspecified: Secondary | ICD-10-CM | POA: Insufficient documentation

## 2023-08-31 DIAGNOSIS — R739 Hyperglycemia, unspecified: Secondary | ICD-10-CM | POA: Insufficient documentation

## 2023-08-31 DIAGNOSIS — R030 Elevated blood-pressure reading, without diagnosis of hypertension: Secondary | ICD-10-CM | POA: Insufficient documentation

## 2023-08-31 LAB — LIPID PANEL
Cholesterol: 204 mg/dL — ABNORMAL HIGH (ref 0–200)
HDL: 64 mg/dL (ref >40)
LDL Cholesterol: 121 mg/dL — ABNORMAL HIGH (ref 0–99)
Total CHOL/HDL Ratio: 3.2 {ratio}
Triglycerides: 95 mg/dL (ref <150)
VLDL: 19 mg/dL (ref 0–40)

## 2023-08-31 LAB — HEMOGLOBIN A1C
Hgb A1c MFr Bld: 5.5 % (ref 4.8–5.6)
Mean Plasma Glucose: 111.15 mg/dL

## 2023-08-31 LAB — CBC WITH DIFFERENTIAL/PLATELET
Abs Immature Granulocytes: 0.02 10*3/uL (ref 0.00–0.07)
Basophils Absolute: 0 10*3/uL (ref 0.0–0.1)
Basophils Relative: 1 %
Eosinophils Absolute: 0.2 10*3/uL (ref 0.0–0.5)
Eosinophils Relative: 4 %
HCT: 42.1 % (ref 36.0–46.0)
Hemoglobin: 13.5 g/dL (ref 12.0–15.0)
Immature Granulocytes: 0 %
Lymphocytes Relative: 26 %
Lymphs Abs: 1.3 10*3/uL (ref 0.7–4.0)
MCH: 29.5 pg (ref 26.0–34.0)
MCHC: 32.1 g/dL (ref 30.0–36.0)
MCV: 92.1 fL (ref 80.0–100.0)
Monocytes Absolute: 0.4 10*3/uL (ref 0.1–1.0)
Monocytes Relative: 8 %
Neutro Abs: 3.2 10*3/uL (ref 1.7–7.7)
Neutrophils Relative %: 61 %
Platelets: 273 10*3/uL (ref 150–400)
RBC: 4.57 MIL/uL (ref 3.87–5.11)
RDW: 13.8 % (ref 11.5–15.5)
WBC: 5.2 10*3/uL (ref 4.0–10.5)
nRBC: 0 % (ref 0.0–0.2)

## 2023-08-31 LAB — COMPREHENSIVE METABOLIC PANEL
ALT: 20 U/L (ref 0–44)
AST: 21 U/L (ref 15–41)
Albumin: 3.8 g/dL (ref 3.5–5.0)
Alkaline Phosphatase: 91 U/L (ref 38–126)
Anion gap: 9 (ref 5–15)
BUN: 14 mg/dL (ref 8–23)
CO2: 28 mmol/L (ref 22–32)
Calcium: 9.5 mg/dL (ref 8.9–10.3)
Chloride: 102 mmol/L (ref 98–111)
Creatinine, Ser: 0.73 mg/dL (ref 0.44–1.00)
GFR, Estimated: >60 mL/min (ref >60)
Glucose, Bld: 103 mg/dL — ABNORMAL HIGH (ref 70–99)
Potassium: 4.7 mmol/L (ref 3.5–5.1)
Sodium: 139 mmol/L (ref 135–145)
Total Bilirubin: 0.7 mg/dL (ref 0.0–1.2)
Total Protein: 7.5 g/dL (ref 6.5–8.1)

## 2023-08-31 LAB — TSH: TSH: 2.326 u[IU]/mL (ref 0.350–4.500)

## 2023-08-31 LAB — VITAMIN D 25 HYDROXY (VIT D DEFICIENCY, FRACTURES): Vit D, 25-Hydroxy: 42.87 ng/mL (ref 30–100)

## 2023-09-03 ENCOUNTER — Ambulatory Visit (HOSPITAL_COMMUNITY)
Admission: RE | Admit: 2023-09-03 | Discharge: 2023-09-03 | Disposition: A | Discharge status: Home / Self Care | Payer: Medicare Other | Source: Ambulatory Visit | Attending: Internal Medicine | Admitting: Internal Medicine

## 2023-09-03 DIAGNOSIS — Z1231 Encounter for screening mammogram for malignant neoplasm of breast: Secondary | ICD-10-CM | POA: Diagnosis not present

## 2023-09-07 ENCOUNTER — Ambulatory Visit (INDEPENDENT_AMBULATORY_CARE_PROVIDER_SITE_OTHER): Payer: Medicare Other | Admitting: Internal Medicine

## 2023-09-07 ENCOUNTER — Encounter: Payer: Self-pay | Admitting: Internal Medicine

## 2023-09-07 VITALS — BP 138/84 | HR 73 | Ht 65.0 in | Wt 211.8 lb

## 2023-09-07 DIAGNOSIS — Z0001 Encounter for general adult medical examination with abnormal findings: Secondary | ICD-10-CM

## 2023-09-07 DIAGNOSIS — E66812 Obesity, class 2: Secondary | ICD-10-CM

## 2023-09-07 DIAGNOSIS — L219 Seborrheic dermatitis, unspecified: Secondary | ICD-10-CM

## 2023-09-07 DIAGNOSIS — E782 Mixed hyperlipidemia: Secondary | ICD-10-CM

## 2023-09-07 DIAGNOSIS — R030 Elevated blood-pressure reading, without diagnosis of hypertension: Secondary | ICD-10-CM

## 2023-09-07 DIAGNOSIS — Z6835 Body mass index (BMI) 35.0-35.9, adult: Secondary | ICD-10-CM

## 2023-09-07 NOTE — Assessment & Plan Note (Signed)
Physical exam as documented. Fasting blood tests reviewed and discussed with the patient in detail.

## 2023-09-07 NOTE — Assessment & Plan Note (Signed)
 BP Readings from Last 1 Encounters:  09/07/23 138/84   Home BP readings reviewed - mostly around 120s-130s/70s Advised to check BP at home and contact if persistently elevated - > 140/90 Advised DASH diet and moderate exercise/walking, at least 150 mins/week

## 2023-09-07 NOTE — Assessment & Plan Note (Signed)
Well-controlled with Kenalog PRN

## 2023-09-07 NOTE — Progress Notes (Signed)
 Established Patient Office Visit  Subjective:  Patient ID: Penny Mcguire, female    DOB: April 11, 1956  Age: 68 y.o. MRN: [id number]  CC:  Chief Complaint  Patient presents with   Care Management    3 month f/u   Annual Exam    HPI Penny Mcguire is a 68 y.o. female with past medical history of eczema, chronic low back pain and obesity who presents for annual physical.  She has been doing well overall. Her rash on scalp has improved now. She requests a refill of it.  She has lost about 9 lbs since the last visit with low-carb diet.  She received Tdap vaccine in the office today.   Past Medical History:  Diagnosis Date   Fibroid uterus     Past Surgical History:  Procedure Laterality Date   CESAREAN SECTION     2   CHOLECYSTECTOMY      Family History  Problem Relation Age of Onset   Dementia Maternal Grandmother    Hyperlipidemia Mother    Hypertension Mother    Dementia Mother    Diabetes Brother    Hyperlipidemia Sister     Social History   Socioeconomic History   Marital status: Married    Spouse name: Not on file   Number of children: Not on file   Years of education: Not on file   Highest education level: Not on file  Occupational History   Not on file  Tobacco Use   Smoking status: Never   Smokeless tobacco: Never  Vaping Use   Vaping status: Never Used  Substance and Sexual Activity   Alcohol use: No   Drug use: No   Sexual activity: Yes    Birth control/protection: Post-menopausal  Other Topics Concern   Not on file  Social History Narrative   Not on file   Social Drivers of Health   Financial Resource Strain: Low Risk  (06/01/2023)   Overall Financial Resource Strain (CARDIA)    Difficulty of Paying Living Expenses: Not hard at all  Food Insecurity: No Food Insecurity (06/01/2023)   Hunger Vital Sign    Worried About Running Out of Food in the Last Year: Never true    Ran Out of Food in the Last Year: Never true  Transportation  Needs: No Transportation Needs (06/01/2023)   PRAPARE - Administrator, Civil Service (Medical): No    Lack of Transportation (Non-Medical): No  Physical Activity: Insufficiently Active (06/01/2023)   Exercise Vital Sign    Days of Exercise per Week: 1 day    Minutes of Exercise per Session: 30 min  Stress: No Stress Concern Present (06/01/2023)   [employer] of Occupational Health - Occupational Stress Questionnaire    Feeling of Stress : Not at all  Social Connections: Moderately Integrated (06/01/2023)   Social Connection and Isolation Panel [NHANES]    Frequency of Communication with Friends and Family: Twice a week    Frequency of Social Gatherings with Friends and Family: Once a week    Attends Religious Services: More than 4 times per year    Active Member of [employer] or Organizations: No    Attends Banker Meetings: Never    Marital Status: Married  Catering Manager Violence: Not At Risk (01/14/2021)   Humiliation, Afraid, Rape, and Kick questionnaire    Fear of Current or Ex-Partner: No    Emotionally Abused: No    Physically Abused: No    Sexually  Abused: No    Outpatient Medications Prior to Visit  Medication Sig Dispense Refill   albuterol  (VENTOLIN  HFA) 108 (90 Base) MCG/ACT inhaler Inhale 2 puffs into the lungs every 6 (six) hours as needed for wheezing or shortness of breath. 18 g 0   ketoconazole  (NIZORAL ) 2 % shampoo Apply 1 Application topically 2 (two) times a week. 120 mL 1   loratadine (CLARITIN) 10 MG tablet Take 10 mg by mouth daily.     Naproxen Sodium (ALEVE PO) Take by mouth.     TURMERIC PO Take by mouth.     ciprofloxacin -dexamethasone  (CIPRODEX ) OTIC suspension Place 4 drops into both ears 2 (two) times daily. 7.5 mL 0   No facility-administered medications prior to visit.    No Known Allergies  ROS Review of Systems  Constitutional:  Negative for chills and fever.  HENT:  Negative for congestion, sinus pressure, sinus  pain and sore throat.   Eyes:  Negative for pain and discharge.  Respiratory:  Negative for cough and shortness of breath.   Cardiovascular:  Negative for chest pain and palpitations.  Gastrointestinal:  Negative for abdominal pain, diarrhea, nausea and vomiting.  Endocrine: Negative for polydipsia and polyuria.  Genitourinary:  Negative for dysuria and hematuria.  Musculoskeletal:  Negative for neck pain and neck stiffness.  Skin:  Negative for rash.  Neurological:  Negative for dizziness and weakness.  Psychiatric/Behavioral:  Negative for agitation and behavioral problems.       Objective:    Physical Exam Vitals reviewed.  Constitutional:      General: She is not in acute distress.    Appearance: She is obese. She is not diaphoretic.  HENT:     Head: Normocephalic and atraumatic.     Nose: Nose normal.     Mouth/Throat:     Mouth: Mucous membranes are moist.  Eyes:     General: No scleral icterus.    Extraocular Movements: Extraocular movements intact.  Cardiovascular:     Rate and Rhythm: Normal rate and regular rhythm.     Pulses: Normal pulses.     Heart sounds: Normal heart sounds. No murmur heard. Pulmonary:     Breath sounds: Normal breath sounds. No wheezing or rales.  Abdominal:     Palpations: Abdomen is soft.     Tenderness: There is no abdominal tenderness.  Musculoskeletal:     Cervical back: Neck supple. No tenderness.     Right lower leg: No edema.     Left lower leg: No edema.  Skin:    General: Skin is warm.     Findings: No rash.  Neurological:     General: No focal deficit present.     Mental Status: She is alert and oriented to person, place, and time.     Cranial Nerves: No cranial nerve deficit.     Sensory: No sensory deficit.     Motor: No weakness.  Psychiatric:        Mood and Affect: Mood normal.        Behavior: Behavior normal.     BP 138/84   Pulse 73   Ht 5' 5 (1.651 m)   Wt 211 lb 12.8 oz (96.1 kg)   SpO2 96%   BMI  35.25 kg/m  Wt Readings from Last 3 Encounters:  09/07/23 211 lb 12.8 oz (96.1 kg)  06/01/23 220 lb 9.6 oz (100.1 kg)  11/22/22 215 lb (97.5 kg)    Lab Results  Component Value Date  TSH 2.326 08/31/2023   Lab Results  Component Value Date   WBC 5.2 08/31/2023   HGB 13.5 08/31/2023   HCT 42.1 08/31/2023   MCV 92.1 08/31/2023   PLT 273 08/31/2023   Lab Results  Component Value Date   NA 139 08/31/2023   K 4.7 08/31/2023   CO2 28 08/31/2023   GLUCOSE 103 (H) 08/31/2023   BUN 14 08/31/2023   CREATININE 0.73 08/31/2023   BILITOT 0.7 08/31/2023   ALKPHOS 91 08/31/2023   AST 21 08/31/2023   ALT 20 08/31/2023   PROT 7.5 08/31/2023   ALBUMIN 3.8 08/31/2023   CALCIUM 9.5 08/31/2023   ANIONGAP 9 08/31/2023   Lab Results  Component Value Date   CHOL 204 (H) 08/31/2023   Lab Results  Component Value Date   HDL 64 08/31/2023   Lab Results  Component Value Date   LDLCALC 121 (H) 08/31/2023   Lab Results  Component Value Date   TRIG 95 08/31/2023   Lab Results  Component Value Date   CHOLHDL 3.2 08/31/2023   Lab Results  Component Value Date   HGBA1C 5.5 08/31/2023      Assessment & Plan:   Problem List Items Addressed This Visit       Musculoskeletal and Integument   Seborrheic dermatitis of scalp   Improved with Ketoconazole  and oil application now        Other   Obesity, unspecified   BMI Readings from Last 3 Encounters:  09/07/23 35.25 kg/m  06/01/23 36.71 kg/m  11/22/22 35.78 kg/m   Diet modification and moderate exercise/walking at least 150 mins/week Has lost about additional 9 lbs with low carb diet, encouraged to continue her efforts      Encounter for general adult medical examination with abnormal findings - Primary   Physical exam as documented. Fasting blood tests reviewed and discussed with the patient in detail.      Mixed hyperlipidemia   Checked lipid profile Advised to follow low carb diet for now       Prehypertension   BP Readings from Last 1 Encounters:  09/07/23 138/84   Home BP readings reviewed - mostly around 120s-130s/70s Advised to check BP at home and contact if persistently elevated - > 140/90 Advised DASH diet and moderate exercise/walking, at least 150 mins/week        No orders of the defined types were placed in this encounter.   Follow-up: Return in about 1 year (around 09/06/2024) for Annual physical.    Suzzane MARLA Blanch, MD

## 2023-09-07 NOTE — Patient Instructions (Signed)
Please continue to follow low carb diet and perform moderate exercise/walking at least 150 mins/week.

## 2023-09-07 NOTE — Assessment & Plan Note (Addendum)
 BMI Readings from Last 3 Encounters:  09/07/23 35.25 kg/m  06/01/23 36.71 kg/m  11/22/22 35.78 kg/m   Diet modification and moderate exercise/walking at least 150 mins/week Has lost about additional 9 lbs with low carb diet, encouraged to continue her efforts

## 2023-09-07 NOTE — Assessment & Plan Note (Signed)
 Checked lipid profile Advised to follow low carb diet for now

## 2023-09-07 NOTE — Assessment & Plan Note (Signed)
Improved with Ketoconazole and oil application now

## 2023-11-19 DIAGNOSIS — R059 Cough, unspecified: Secondary | ICD-10-CM | POA: Diagnosis not present

## 2023-11-19 DIAGNOSIS — J309 Allergic rhinitis, unspecified: Secondary | ICD-10-CM | POA: Diagnosis not present

## 2023-11-21 ENCOUNTER — Ambulatory Visit: Payer: Self-pay | Admitting: Internal Medicine

## 2023-12-04 ENCOUNTER — Encounter: Payer: Self-pay | Admitting: Internal Medicine

## 2023-12-04 ENCOUNTER — Ambulatory Visit (INDEPENDENT_AMBULATORY_CARE_PROVIDER_SITE_OTHER): Payer: Self-pay | Admitting: Internal Medicine

## 2023-12-04 VITALS — BP 124/82 | HR 84 | Ht 65.0 in | Wt 211.4 lb

## 2023-12-04 DIAGNOSIS — J0101 Acute recurrent maxillary sinusitis: Secondary | ICD-10-CM | POA: Diagnosis not present

## 2023-12-04 DIAGNOSIS — J302 Other seasonal allergic rhinitis: Secondary | ICD-10-CM | POA: Insufficient documentation

## 2023-12-04 MED ORDER — AZITHROMYCIN 250 MG PO TABS: ORAL_TABLET | ORAL | 0 refills | Status: AC

## 2023-12-04 MED ORDER — LEVOCETIRIZINE DIHYDROCHLORIDE 5 MG PO TABS: 5.0000 mg | ORAL_TABLET | Freq: Every evening | ORAL | 0 refills | Status: AC

## 2023-12-04 MED ORDER — FLUTICASONE PROPIONATE 50 MCG/ACT NA SUSP
2.0000 | Freq: Every day | NASAL | 1 refills | Status: AC
Start: 2023-12-04 — End: ?

## 2023-12-04 NOTE — Assessment & Plan Note (Signed)
 Due to persistent symptoms despite symptomatic treatment, started empiric azithromycin  Flonase  for nasal congestion/allergies Xyzal for allergies Advised to perform sinus rinse and/or use sinus inhaler Warm water gargling for sore throat

## 2023-12-04 NOTE — Assessment & Plan Note (Signed)
 Has tried Claritin without much relief Flonase  for nasal congestion/allergies Xyzal for allergies

## 2023-12-04 NOTE — Progress Notes (Signed)
 Acute Office Visit  Subjective:    Patient ID: Penny Mcguire, female    DOB: 21-May-1956, 68 y.o.   MRN: [id number]  Chief Complaint  Patient presents with   seasonal allergies    Reports sx of seasonal allergies not getting better.     HPI Patient is in today for complaint of nasal congestion, sinus pressure related headache and facial pain, postnasal drip and dry cough for the last 3 weeks.  She went to urgent care about 2 weeks ago, was given Bromfed syrup and oral prednisone , but did not help much with her symptoms.  She has been taking Claritin for allergies as well.  Denies any fever or chills.  Denies dyspnea or wheezing currently.  Past Medical History:  Diagnosis Date   Fibroid uterus     Past Surgical History:  Procedure Laterality Date   CESAREAN SECTION     2   CHOLECYSTECTOMY      Family History  Problem Relation Age of Onset   Dementia Maternal Grandmother    Hyperlipidemia Mother    Hypertension Mother    Dementia Mother    Diabetes Brother    Hyperlipidemia Sister     Social History   Socioeconomic History   Marital status: Married    Spouse name: Not on file   Number of children: Not on file   Years of education: Not on file   Highest education level: Not on file  Occupational History   Not on file  Tobacco Use   Smoking status: Never   Smokeless tobacco: Never  Vaping Use   Vaping status: Never Used  Substance and Sexual Activity   Alcohol use: No   Drug use: No   Sexual activity: Yes    Birth control/protection: Post-menopausal  Other Topics Concern   Not on file  Social History Narrative   Not on file   Social Drivers of Health   Financial Resource Strain: Low Risk  (06/01/2023)   Overall Financial Resource Strain (CARDIA)    Difficulty of Paying Living Expenses: Not hard at all  Food Insecurity: No Food Insecurity (06/01/2023)   Hunger Vital Sign    Worried About Running Out of Food in the Last Year: Never true    Ran Out of  Food in the Last Year: Never true  Transportation Needs: No Transportation Needs (06/01/2023)   PRAPARE - Administrator, Civil Service (Medical): No    Lack of Transportation (Non-Medical): No  Physical Activity: Insufficiently Active (06/01/2023)   Exercise Vital Sign    Days of Exercise per Week: 1 day    Minutes of Exercise per Session: 30 min  Stress: No Stress Concern Present (06/01/2023)   [employer] of Occupational Health - Occupational Stress Questionnaire    Feeling of Stress : Not at all  Social Connections: Moderately Integrated (06/01/2023)   Social Connection and Isolation Panel [NHANES]    Frequency of Communication with Friends and Family: Twice a week    Frequency of Social Gatherings with Friends and Family: Once a week    Attends Religious Services: More than 4 times per year    Active Member of [employer] or Organizations: No    Attends Banker Meetings: Never    Marital Status: Married  Catering manager Violence: Not At Risk (01/14/2021)   Humiliation, Afraid, Rape, and Kick questionnaire    Fear of Current or Ex-Partner: No    Emotionally Abused: No    Physically Abused:  No    Sexually Abused: No    Outpatient Medications Prior to Visit  Medication Sig Dispense Refill   albuterol  (VENTOLIN  HFA) 108 (90 Base) MCG/ACT inhaler Inhale 2 puffs into the lungs every 6 (six) hours as needed for wheezing or shortness of breath. 18 g 0   ketoconazole  (NIZORAL ) 2 % shampoo Apply 1 Application topically 2 (two) times a week. 120 mL 1   Naproxen Sodium (ALEVE PO) Take by mouth.     TURMERIC PO Take by mouth.     brompheniramine-pseudoephedrine-DM 30-2-10 MG/5ML syrup Take 10 mLs by mouth 4 (four) times daily as needed.     loratadine (CLARITIN) 10 MG tablet Take 10 mg by mouth daily.     predniSONE  (DELTASONE ) 20 MG tablet Take 20 mg by mouth 2 (two) times daily with a meal.     No facility-administered medications prior to visit.    No Known  Allergies  Review of Systems  Constitutional:  Negative for chills and fever.  HENT:  Positive for congestion, postnasal drip, sinus pressure, sinus pain and sore throat.   Eyes:  Negative for pain and discharge.  Respiratory:  Positive for cough. Negative for shortness of breath.   Cardiovascular:  Negative for chest pain and palpitations.  Gastrointestinal:  Negative for abdominal pain, constipation, diarrhea, nausea and vomiting.  Endocrine: Negative for polydipsia and polyuria.  Genitourinary:  Negative for dysuria and hematuria.  Musculoskeletal:  Negative for neck pain and neck stiffness.  Skin:  Negative for rash.  Allergic/Immunologic: Positive for environmental allergies.  Neurological:  Negative for dizziness and weakness.  Psychiatric/Behavioral:  Negative for agitation and behavioral problems.        Objective:    Physical Exam Vitals reviewed.  Constitutional:      General: She is not in acute distress.    Appearance: She is obese. She is not diaphoretic.  HENT:     Head: Normocephalic and atraumatic.     Nose: Congestion present.     Right Sinus: Maxillary sinus tenderness present.     Left Sinus: Maxillary sinus tenderness present.     Mouth/Throat:     Mouth: Mucous membranes are moist.  Eyes:     General: No scleral icterus.    Extraocular Movements: Extraocular movements intact.  Cardiovascular:     Rate and Rhythm: Normal rate and regular rhythm.     Heart sounds: Normal heart sounds. No murmur heard. Pulmonary:     Breath sounds: Normal breath sounds. No wheezing or rales.  Musculoskeletal:     Cervical back: Neck supple. No tenderness.     Right lower leg: No edema.     Left lower leg: No edema.  Skin:    General: Skin is warm.     Findings: No rash.  Neurological:     General: No focal deficit present.     Mental Status: She is alert and oriented to person, place, and time.  Psychiatric:        Mood and Affect: Mood normal.        Behavior:  Behavior normal.     BP 124/82   Pulse 84   Ht 5\' 5"  (1.651 m)   Wt 211 lb 6.4 oz (95.9 kg)   SpO2 96%   BMI 35.18 kg/m  Wt Readings from Last 3 Encounters:  12/04/23 211 lb 6.4 oz (95.9 kg)  09/07/23 211 lb 12.8 oz (96.1 kg)  06/01/23 220 lb 9.6 oz (100.1 kg)  Assessment & Plan:   Problem List Items Addressed This Visit       Respiratory   Acute recurrent maxillary sinusitis - Primary   Due to persistent symptoms despite symptomatic treatment, started empiric azithromycin  Flonase  for nasal congestion/allergies Xyzal for allergies Advised to perform sinus rinse and/or use sinus inhaler Warm water gargling for sore throat      Relevant Medications   azithromycin  (ZITHROMAX ) 250 MG tablet   fluticasone  (FLONASE ) 50 MCG/ACT nasal spray   levocetirizine (XYZAL) 5 MG tablet     Other   Seasonal allergies   Has tried Claritin without much relief Flonase  for nasal congestion/allergies Xyzal for allergies      Relevant Medications   fluticasone  (FLONASE ) 50 MCG/ACT nasal spray   levocetirizine (XYZAL) 5 MG tablet     Meds ordered this encounter  Medications   azithromycin  (ZITHROMAX ) 250 MG tablet    Sig: Take 2 tablets on day 1, then 1 tablet daily on days 2 through 5    Dispense:  6 tablet    Refill:  0   fluticasone  (FLONASE ) 50 MCG/ACT nasal spray    Sig: Place 2 sprays into both nostrils daily.    Dispense:  16 g    Refill:  1   levocetirizine (XYZAL) 5 MG tablet    Sig: Take 1 tablet (5 mg total) by mouth every evening.    Dispense:  30 tablet    Refill:  0     Javayah Magaw Alyssa Backbone, MD

## 2023-12-04 NOTE — Patient Instructions (Addendum)
 Please start taking Azithromycin  as prescribed.  Please use Flonase  for nasal congestion/allergies.  Please take Xyzal for allergies.  Please perform sinus rinse and/or use sinus inhaler for steam inhalation.

## 2024-02-15 DIAGNOSIS — K080 Exfoliation of teeth due to systemic causes: Secondary | ICD-10-CM | POA: Diagnosis not present

## 2024-06-12 DIAGNOSIS — H5203 Hypermetropia, bilateral: Secondary | ICD-10-CM | POA: Diagnosis not present

## 2024-09-12 ENCOUNTER — Encounter: Payer: Medicare Other | Admitting: Internal Medicine
